# Patient Record
Sex: Male | Born: 1985 | Race: Black or African American | Hispanic: No | Smoking: Heavy tobacco smoker
Health system: Southern US, Community
[De-identification: ages and names within clinical notes are randomized; demographics above are authoritative.]

## PROBLEM LIST (undated history)

## (undated) HISTORY — PX: TONSILLECTOMY: SUR1361

---

## 2014-09-08 ENCOUNTER — Emergency Department (HOSPITAL_BASED_OUTPATIENT_CLINIC_OR_DEPARTMENT_OTHER)
Admission: EM | Admit: 2014-09-08 | Discharge: 2014-09-09 | Disposition: A | Discharge status: Home / Self Care | Payer: Self-pay | Attending: Emergency Medicine | Admitting: Emergency Medicine

## 2014-09-08 ENCOUNTER — Emergency Department (HOSPITAL_BASED_OUTPATIENT_CLINIC_OR_DEPARTMENT_OTHER): Payer: Self-pay

## 2014-09-08 ENCOUNTER — Encounter (HOSPITAL_BASED_OUTPATIENT_CLINIC_OR_DEPARTMENT_OTHER): Payer: Self-pay | Admitting: Emergency Medicine

## 2014-09-08 DIAGNOSIS — J069 Acute upper respiratory infection, unspecified: Secondary | ICD-10-CM | POA: Insufficient documentation

## 2014-09-08 DIAGNOSIS — R05 Cough: Secondary | ICD-10-CM | POA: Insufficient documentation

## 2014-09-08 DIAGNOSIS — R059 Cough, unspecified: Secondary | ICD-10-CM

## 2014-09-08 LAB — RAPID STREP SCREEN (MED CTR MEBANE ONLY): Streptococcus, Group A Screen (Direct): NEGATIVE

## 2014-09-08 MED ORDER — ALBUTEROL SULFATE HFA 108 (90 BASE) MCG/ACT IN AERS
2.0000 | INHALATION_SPRAY | Freq: Once | RESPIRATORY_TRACT | Status: AC
  Administered 2014-09-09: 2 via RESPIRATORY_TRACT
  Filled 2014-09-08: qty 6.7

## 2014-09-08 MED ORDER — HYDROCODONE-ACETAMINOPHEN 5-325 MG PO TABS: ORAL_TABLET | ORAL | Status: DC

## 2014-09-08 MED ORDER — HYDROCODONE-ACETAMINOPHEN 5-325 MG PO TABS
1.0000 | ORAL_TABLET | Freq: Once | ORAL | Status: AC
  Administered 2014-09-08: 1 via ORAL
  Filled 2014-09-08: qty 1

## 2014-09-08 MED ORDER — ALBUTEROL SULFATE (2.5 MG/3ML) 0.083% IN NEBU
INHALATION_SOLUTION | RESPIRATORY_TRACT | Status: AC
  Administered 2014-09-08: 5 mg via RESPIRATORY_TRACT
  Filled 2014-09-08: qty 6

## 2014-09-08 MED ORDER — IBUPROFEN 400 MG PO TABS
400.0000 mg | ORAL_TABLET | Freq: Once | ORAL | Status: AC
  Administered 2014-09-08: 400 mg via ORAL

## 2014-09-08 MED ORDER — ALBUTEROL SULFATE (2.5 MG/3ML) 0.083% IN NEBU
5.0000 mg | INHALATION_SOLUTION | Freq: Once | RESPIRATORY_TRACT | Status: AC
  Administered 2014-09-08: 5 mg via RESPIRATORY_TRACT

## 2014-09-08 MED ORDER — IBUPROFEN 400 MG PO TABS
ORAL_TABLET | ORAL | Status: AC
  Filled 2014-09-08: qty 1

## 2014-09-08 NOTE — ED Notes (Signed)
Pt rports upper resp signs and symptoms cough and chest soreness OTC meds ineffective

## 2014-09-08 NOTE — Discharge Instructions (Signed)
Take vicodin for breakthrough pain, do not drink alcohol, drive, care for children or do other critical tasks while taking vicodin.  Do not hesitate to return to the emergency room for any new, worsening or concerning symptoms.  Please obtain primary care using resource guide below. But the minute you were seen in the emergency room and that they will need to obtain records for further outpatient management.   Upper Respiratory Infection, Adult An upper respiratory infection (URI) is also sometimes known as the common cold. The upper respiratory tract includes the nose, sinuses, throat, trachea, and bronchi. Bronchi are the airways leading to the lungs. Most people improve within 1 week, but symptoms can last up to 2 weeks. A residual cough may last even longer.  CAUSES Many different viruses can infect the tissues lining the upper respiratory tract. The tissues become irritated and inflamed and often become very moist. Mucus production is also common. A cold is contagious. You can easily spread the virus to others by oral contact. This includes kissing, sharing a glass, coughing, or sneezing. Touching your mouth or nose and then touching a surface, which is then touched by another person, can also spread the virus. SYMPTOMS  Symptoms typically develop 1 to 3 days after you come in contact with a cold virus. Symptoms vary from person to person. They may include:  Runny nose.  Sneezing.  Nasal congestion.  Sinus irritation.  Sore throat.  Loss of voice (laryngitis).  Cough.  Fatigue.  Muscle aches.  Loss of appetite.  Headache.  Low-grade fever. DIAGNOSIS  You might diagnose your own cold based on familiar symptoms, since most people get a cold 2 to 3 times a year. Your caregiver can confirm this based on your exam. Most importantly, your caregiver can check that your symptoms are not due to another disease such as strep throat, sinusitis, pneumonia, asthma, or epiglottitis.  Blood tests, throat tests, and X-rays are not necessary to diagnose a common cold, but they may sometimes be helpful in excluding other more serious diseases. Your caregiver will decide if any further tests are required. RISKS AND COMPLICATIONS  You may be at risk for a more severe case of the common cold if you smoke cigarettes, have chronic heart disease (such as heart failure) or lung disease (such as asthma), or if you have a weakened immune system. The very young and very old are also at risk for more serious infections. Bacterial sinusitis, middle ear infections, and bacterial pneumonia can complicate the common cold. The common cold can worsen asthma and chronic obstructive pulmonary disease (COPD). Sometimes, these complications can require emergency medical care and may be life-threatening. PREVENTION  The best way to protect against getting a cold is to practice good hygiene. Avoid oral or hand contact with people with cold symptoms. Wash your hands often if contact occurs. There is no clear evidence that vitamin C, vitamin E, echinacea, or exercise reduces the chance of developing a cold. However, it is always recommended to get plenty of rest and practice good nutrition. TREATMENT  Treatment is directed at relieving symptoms. There is no cure. Antibiotics are not effective, because the infection is caused by a virus, not by bacteria. Treatment may include:  Increased fluid intake. Sports drinks offer valuable electrolytes, sugars, and fluids.  Breathing heated mist or steam (vaporizer or shower).  Eating chicken soup or other clear broths, and maintaining good nutrition.  Getting plenty of rest.  Using gargles or lozenges for comfort.  Controlling  fevers with ibuprofen or acetaminophen as directed by your caregiver.  Increasing usage of your inhaler if you have asthma. Zinc gel and zinc lozenges, taken in the first 24 hours of the common cold, can shorten the duration and lessen the  severity of symptoms. Pain medicines may help with fever, muscle aches, and throat pain. A variety of non-prescription medicines are available to treat congestion and runny nose. Your caregiver can make recommendations and may suggest nasal or lung inhalers for other symptoms.  HOME CARE INSTRUCTIONS   Only take over-the-counter or prescription medicines for pain, discomfort, or fever as directed by your caregiver.  Use a warm mist humidifier or inhale steam from a shower to increase air moisture. This may keep secretions moist and make it easier to breathe.  Drink enough water and fluids to keep your urine clear or pale yellow.  Rest as needed.  Return to work when your temperature has returned to normal or as your caregiver advises. You may need to stay home longer to avoid infecting others. You can also use a face mask and careful hand washing to prevent spread of the virus. SEEK MEDICAL CARE IF:   After the first few days, you feel you are getting worse rather than better.  You need your caregiver's advice about medicines to control symptoms.  You develop chills, worsening shortness of breath, or brown or red sputum. These may be signs of pneumonia.  You develop yellow or brown nasal discharge or pain in the face, especially when you bend forward. These may be signs of sinusitis.  You develop a fever, swollen neck glands, pain with swallowing, or white areas in the back of your throat. These may be signs of strep throat. SEEK IMMEDIATE MEDICAL CARE IF:   You have a fever.  You develop severe or persistent headache, ear pain, sinus pain, or chest pain.  You develop wheezing, a prolonged cough, cough up blood, or have a change in your usual mucus (if you have chronic lung disease).  You develop sore muscles or a stiff neck. Document Released: 12/22/2000 Document Revised: 09/20/2011 Document Reviewed: 10/03/2013 [facility] Patient Information 2015 [place], [place]. This information is  not intended to replace advice given to you by your health care provider. Make sure you discuss any questions you have with your health care provider.    Emergency Department Resource Guide 1) Find a Doctor and Pay Out of Pocket Although you won't have to find out who is covered by your insurance plan, it is a good idea to ask around and get recommendations. You will then need to call the office and see if the doctor you have chosen will accept you as a new patient and what types of options they offer for patients who are self-pay. Some doctors offer discounts or will set up payment plans for their patients who do not have insurance, but you will need to ask so you aren't surprised when you get to your appointment.  2) Contact Your Local Health Department Not all health departments have doctors that can see patients for sick visits, but many do, so it is worth a call to see if yours does. If you don't know where your local health department is, you can check in your phone book. The CDC also has a tool to help you locate your state's health department, and many state websites also have listings of all of their local health departments.  3) Find a Walk-in Clinic If your illness is not  likely to be very severe or complicated, you may want to try a walk in clinic. These are popping up all over the country in pharmacies, drugstores, and shopping centers. They're usually staffed by nurse practitioners or physician assistants that have been trained to treat common illnesses and complaints. They're usually fairly quick and inexpensive. However, if you have serious medical issues or chronic medical problems, these are probably not your best option.  No Primary Care Doctor: - Call Health Connect at  [phone] - they can help you locate a primary care doctor that  accepts your insurance, provides certain services, etc. - Physician Referral Service- [phone]  Chronic Pain Problems: Organization          Address  Phone   Notes  [facility]  [phone] Patients need to be referred by their primary care doctor.   Medication Assistance: Organization         Address  Phone   Notes  [facility] Medication [facility] [address] [place], [place] [address] [phone] --Must be a resident of [facility] -- Must have NO insurance coverage whatsoever (no Medicaid/ Medicare, etc.) -- The pt. MUST have a primary care doctor that directs their care regularly and follows them in the community   MedAssist  [phone]   [employer]  [phone]    Agencies that provide inexpensive medical care: Organization         Address  Phone   Notes  [place]  [phone]   [facility]    [phone]   [facility] [place], [place] [address] [phone]   [place] [address], [place] [phone]   Planned Parenthood    [phone]   [facility]    [phone]   [place] [address], [place] Phone:  [phone], Fax:  [phone] Hours of Operation:  9 am - 6 pm, M-F.  Also accepts Medicaid/Medicare and self-pay.  [facility] for [place], [address], [place] Phone: [phone], Fax: [phone]. Hours of Operation:  8:30 am - 5:30 pm, M-F.  Also accepts Medicaid and self-pay.  [facility] [address] [place] Phone: [phone]   [place], [place], [place] [phone], Ext. [phone] Mondays & Thursdays: 7-9 AM.  First 15 patients are seen on a first come, first serve basis.    [place] Providers:  Organization         Address  Phone   Notes  [facility] [address], [place] [phone] Also accepts self-pay patients.  [facility] [address], [place]  [phone]   [place], [address], [place] [phone]   [facility] [address] [place] [phone]   Lucianne Lei [address], [place]   [phone] Only accepts [place] Access [place] patients after they have their name applied to their card.   Self-Pay (no insurance) in [facility]:  Organization         Address  Phone   Notes  Sickle Cell Patients, [facility] Internal Medicine [place] [phone]   [facility]arias Pontes  [facility] [place] [phone]   [facility] [place]  [place], [place], [place] [phone]   [facility]/Dr. Osei-Bonsu  [address] [place] or [address], [place] [phone] Phone number for both [place] and [place] locations is the same.  [facility] [address] [place] [phone]   [facility] [address], [place] or [address] [phone]   [facility] [address] [place] [phone], phone; [phone], fax Sees patients 1st and 3rd Saturday of every month.  Must not qualify for public or private insurance (i.e. Medicaid, Medicare, [place] Health Choice, Veterans' Benefits)  Household income should be no more than 200% of the poverty level The clinic cannot treat you if you are pregnant or think you are pregnant  Sexually transmitted diseases are not treated at the clinic.    Dental Care: Organization         Address  Phone  Notes  [facility] Department of [facility] [place] [phone] Accepts children up to age 13 who are enrolled in [place] or [place]; pregnant women with a Medicaid card; and  children who have applied for Medicaid or [place] Health Choice, but were declined, whose parents can pay a reduced fee at time of service.  [facility] Department of [facility]  [address], [place] [phone] Accepts children up to age 17 who are enrolled in [place] or [place]; pregnant women with a Medicaid card; and children who have applied for Medicaid or [place] Health Choice, but were declined, whose parents can pay a reduced fee at time of service.  [place] Adult Dental Access PROGRAM  [place] [phone] Patients are seen by appointment only. Walk-ins are not accepted. [place] will see patients 48 years of age and older. Monday - Tuesday (8am-5pm) Most Wednesdays (8:30-5pm) $30 per visit, cash only  [facility] Adult Dental Access PROGRAM  [address], [facility] [phone] Patients are seen by appointment only. Walk-ins are not accepted. [place] will see patients 81 years of age and older. One Wednesday Evening (Monthly: Volunteer Based).  $30 per visit, cash only  [place]  [phone] for adults; Children under age 18, call Graduate Pediatric Dentistry at [phone]. Children aged 77-14, please call [phone] to request a pediatric application.  Dental services are provided in all areas of dental care including fillings, crowns and bridges, complete and partial dentures, implants, gum treatment, root canals, and extractions. Preventive care is also provided. Treatment is provided to both adults and children. Patients are selected via a lottery and there is often a waiting list.   [facility] [address] [place]  [phone] [email or web address]   [facility] [address] [place], [place] [phone], Ext. [phone] Second and Fourth Thursday of each month, opens at 6:30 AM; Clinic ends at 9 AM.  Patients are seen on a first-come first-served  basis, and a limited number are seen during each clinic.   [facility]  [address] [place] Danker [place], [place] [phone]   Eligibility Requirements You must have lived in [place], [place], or [place] counties for at least the last three  months.   You cannot be eligible for state or federal sponsored [employer], including [employer], [place], or [employer].   You generally cannot be eligible for healthcare insurance through your employer.    How to apply: Eligibility screenings are held every Tuesday and Wednesday afternoon from 1:00 pm until 4:00 pm. You do not need an appointment for the interview!  [facility] [address] [place], [place]   [place]  [place] Department  [place]  [phone]    Behavioral Health Resources in the Community: Intensive Outpatient Programs Organization         Address  Phone  Notes  [place]. [address] [place], [place] [phone]   [facility] Outpatient [address], [place], [place]   ADS: Alcohol & Drug Svcs [address], [place], [place]   [place] [address],  [place], [place] or [phone]   Substance Abuse Resources Organization         Address  Phone  Notes  Alcohol and Drug Services  [phone]   [place]  [phone]   The [place]   [place]inita Pester  [phone]   Residential & Outpatient Substance Abuse Program  [phone]   Psychological Services Organization         Address  Phone  Notes  [facility] [place]  [place]  [phone]   [place] [address] [place] or [phone]    Mobile Crisis Teams Organization          Address  Phone  Notes  [facility], Mobile Crisis Care Unit  [phone]   [facility]  [address] [place], [place]   [place] [address] [place] [phone]    Self-Help/Support Groups Organization         Address  Phone             Notes  [place]. of [place] - variety of support groups  [place] Call for more information  Narcotics Anonymous (NA), [facility] [address], [employer] [place]  2 meetings at this location   Special educational needs teacher         Address  Phone  Notes  [facility]idential Treatment [place],    [place]  [phone]   [facility]  [address] [place] [phone], [place], [place]   [place], [place] [phone] Admissions: 8am-3pm M-F  Incentives Substance [place] [address],    [place], [place] [phone]   [facility] [address] [place], [place], [place]   [facility] [address]  [place], [place]   [facility] - Intensive Outpatient [address] Kristeen [address], [place], [place]   [facility] [place].,  [place], [place] [phone] or [phone]   [facility] (RTS) [address] [place], [place] Accepts Medicaid  [facility] [place] [address]  [place] [phone] Substance Abuse/Addiction Treatment   [facility] Organization         Address  Phone  Notes  [facility]  [phone]   Domenic Schwab, PhD  [address] [place], [place]   [phone]) [phone] or [phone]   [facility]   [address] [place], [place] [phone]) [phone]   [facility] [address], [place], [place] [phone] Insurance/Medicaid/sponsorship  through [facility]  Faith and Families [address]                                    [place], [place] [phone]) [phone] Therapy/tele-psych/case  [facility] [address]   [place], [place] [phone]) [phone]    Dr. Lolly MustacheArfeen  [phone]) [phone]   [facility] [place]  [employer] [facility] 1) [address], [place] 2) [address], [place] 3)  [address], [place] [phone]) [phone]) [phone]  [phone](336) [phone]   [facility] Child Abuse Hotline [phone](336) 7183455689 or [phone](336) [phone] (After Hours)

## 2014-09-08 NOTE — ED Provider Notes (Signed)
CSN: [id number]     Arrival date & time 09/08/14  2051 History  This chart was scribed for Steven EmeryNicole Daniyah Fohl, PA-C with No att. providers found by Tonye RoyaltyJoshua Chen, ED Scribe. This patient was seen in room MHT13/MHT13 and the patient's care was started at 10:31 PM.    Chief Complaint  Patient presents with  . URI   The history is provided by the patient. No language interpreter was used.    HPI Comments: Steven BogaWilliam R Lowery is a 29 y.o. male who presents to the Emergency Department complaining of cough with onset 4 days ago and chest pain with onset at 1600 today. He reports associated congestion, sore throat, and SOB. He states his cough is not productive. He states he is using Theraflu at home. He denies history of asthma. He denies nausea, vomiting, or sinus pressure.   History reviewed. No pertinent past medical history. History reviewed. No pertinent past surgical history. History reviewed. No pertinent family history. History  Substance Use Topics  . Smoking status: Heavy Tobacco Smoker  . Smokeless tobacco: Never Used  . Alcohol Use: Yes    Review of Systems  HENT: Positive for congestion and sore throat. Negative for sinus pressure.   Respiratory: Positive for cough.   Cardiovascular: Positive for chest pain.  Gastrointestinal: Negative for nausea and vomiting.      Allergies  Review of patient's allergies indicates no known allergies.  Home Medications   Prior to Admission medications   Medication Sig Start Date End Date Taking? Authorizing Provider  HYDROcodone-acetaminophen (NORCO/VICODIN) 5-325 MG per tablet Take 1-2 tablets by mouth every 6 hours as needed for pain and/or cough. 09/08/14   Dannon Nguyenthi, PA-C   BP 127/76 mmHg  Pulse 71  Temp(Src) 98.9 F (37.2 C) (Oral)  Resp 18  SpO2 98% Physical Exam  Constitutional: He is oriented to person, place, and time. He appears well-developed and well-nourished. No distress.  HENT:  Head: Normocephalic and  atraumatic.  Mouth/Throat: Oropharynx is clear and moist.  + Rhinorrhea, No TTP of sinuses  Eyes: Conjunctivae and EOM are normal. Pupils are equal, round, and reactive to light.  Neck: Normal range of motion.  Cardiovascular: Normal rate, regular rhythm and intact distal pulses.   Pulmonary/Chest: Effort normal and breath sounds normal. No stridor.  Abdominal: Soft. There is no tenderness.  Musculoskeletal: Normal range of motion.  Neurological: He is alert and oriented to person, place, and time.  Skin: He is not diaphoretic.  Psychiatric: He has a normal mood and affect.  Nursing note and vitals reviewed.   ED Course  Procedures (including critical care time)  DIAGNOSTIC STUDIES: Oxygen Saturation is 98% on room air, normal by my interpretation.    COORDINATION OF CARE: 10:34 PM Discussed treatment plan with patient at beside, including chest x-ray. He states he feels improved after breathing treatment. The patient agrees with the plan and has no further questions at this time.   Labs Review Labs Reviewed  RAPID STREP SCREEN  CULTURE, GROUP A STREP    Imaging Review Dg Chest 2 View  09/08/2014   CLINICAL DATA:  Acute onset of cough and chest pain. Congestion, sore throat and shortness of breath. Initial encounter.  EXAM: CHEST  2 VIEW  COMPARISON:  None.  FINDINGS: The lungs are well-aerated and clear. There is no evidence of focal opacification, pleural effusion or pneumothorax.  The heart is normal in size; the mediastinal contour is within normal limits. No acute osseous abnormalities are seen.  IMPRESSION: No acute cardiopulmonary process seen.   Electronically Signed   By: Roanna Raider M.D.   On: 09/08/2014 23:43     EKG Interpretation None      MDM   Final diagnoses:  Cough  Upper respiratory infection   Filed Vitals:   09/08/14 2110 09/08/14 2156  BP: 122/66 127/76  Pulse: 80 71  Temp: 98.5 F (36.9 C) 98.9 F (37.2 C)  TempSrc: Oral Oral  Resp: 18  18  SpO2: 96% 98%    Medications  ibuprofen (ADVIL,MOTRIN) tablet 400 mg (400 mg Oral Given 09/08/14 2205)  albuterol (PROVENTIL) (2.5 MG/3ML) 0.083% nebulizer solution 5 mg (5 mg Nebulization Given 09/08/14 2207)  HYDROcodone-acetaminophen (NORCO/VICODIN) 5-325 MG per tablet 1 tablet (1 tablet Oral Given 09/08/14 2342)  albuterol (PROVENTIL HFA;VENTOLIN HFA) 108 (90 BASE) MCG/ACT inhaler 2 puff (2 puffs Inhalation Given 09/09/14 0003)    Steven Wang is a pleasant 29 y.o. male presenting with URI symtoms. LSTA, CXR negative. VSS, Likely viral. Will give norco for cough.  Evaluation does not show pathology that would require ongoing emergent intervention or inpatient treatment. Pt is hemodynamically stable and mentating appropriately. Discussed findings and plan with patient/guardian, who agrees with care plan. All questions answered. Return precautions discussed and outpatient follow up given.   Discharge Medication List as of 09/08/2014 11:51 PM    START taking these medications   Details  HYDROcodone-acetaminophen (NORCO/VICODIN) 5-325 MG per tablet Take 1-2 tablets by mouth every 6 hours as needed for pain and/or cough., Print         I personally performed the services described in this documentation, which was scribed in my presence. The recorded information has been reviewed and is accurate.   Steven Emery, PA-C 09/09/14 1607  [name]. Rubin Payor, MD 09/10/14 2127

## 2014-09-08 NOTE — ED Notes (Addendum)
Alert, NAD, calm, itneractiove, resps e/u, speaking in clear complete sentences, VSS, c/o non-productive cough, subjective fever, pain with cough, sore throat & congestion. No meds PTA. (denies: nvd, dizziness), sx onset Monday.

## 2014-09-08 NOTE — ED Notes (Signed)
Pt to xray, steady gait

## 2014-09-11 LAB — CULTURE, GROUP A STREP

## 2016-12-22 ENCOUNTER — Encounter (HOSPITAL_BASED_OUTPATIENT_CLINIC_OR_DEPARTMENT_OTHER): Payer: Self-pay | Admitting: *Deleted

## 2016-12-22 ENCOUNTER — Emergency Department (HOSPITAL_BASED_OUTPATIENT_CLINIC_OR_DEPARTMENT_OTHER)
Admission: EM | Admit: 2016-12-22 | Discharge: 2016-12-23 | Disposition: A | Discharge status: Home / Self Care | Payer: Self-pay | Attending: Emergency Medicine | Admitting: Emergency Medicine

## 2016-12-22 ENCOUNTER — Emergency Department (HOSPITAL_BASED_OUTPATIENT_CLINIC_OR_DEPARTMENT_OTHER): Payer: Self-pay

## 2016-12-22 DIAGNOSIS — F17200 Nicotine dependence, unspecified, uncomplicated: Secondary | ICD-10-CM | POA: Insufficient documentation

## 2016-12-22 DIAGNOSIS — N5089 Other specified disorders of the male genital organs: Secondary | ICD-10-CM | POA: Insufficient documentation

## 2016-12-22 LAB — BASIC METABOLIC PANEL
ANION GAP: 10 (ref 5–15)
BUN: 14 mg/dL (ref 6–20)
CALCIUM: 9.7 mg/dL (ref 8.9–10.3)
CO2: 30 mmol/L (ref 22–32)
Chloride: 98 mmol/L — ABNORMAL LOW (ref 101–111)
Creatinine, Ser: 0.95 mg/dL (ref 0.61–1.24)
GFR calc non Af Amer: >60 mL/min (ref >60)
Glucose, Bld: 114 mg/dL — ABNORMAL HIGH (ref 65–99)
Potassium: 3.6 mmol/L (ref 3.5–5.1)
Sodium: 138 mmol/L (ref 135–145)

## 2016-12-22 LAB — CBC WITH DIFFERENTIAL/PLATELET
Basophils Absolute: 0 10*3/uL (ref 0.0–0.1)
Basophils Relative: 0 %
Eosinophils Absolute: 0.1 10*3/uL (ref 0.0–0.7)
Eosinophils Relative: 1 %
HEMATOCRIT: 45 % (ref 39.0–52.0)
Hemoglobin: 15.2 g/dL (ref 13.0–17.0)
Lymphocytes Relative: 28 %
Lymphs Abs: 2.2 10*3/uL (ref 0.7–4.0)
MCH: 26.2 pg (ref 26.0–34.0)
MCHC: 33.8 g/dL (ref 30.0–36.0)
MCV: 77.5 fL — ABNORMAL LOW (ref 78.0–100.0)
Monocytes Absolute: 0.6 10*3/uL (ref 0.1–1.0)
Monocytes Relative: 8 %
NEUTROS ABS: 5.1 10*3/uL (ref 1.7–7.7)
NEUTROS PCT: 63 %
Platelets: 276 10*3/uL (ref 150–400)
RBC: 5.81 MIL/uL (ref 4.22–5.81)
RDW: 14.1 % (ref 11.5–15.5)
WBC: 8.1 10*3/uL (ref 4.0–10.5)

## 2016-12-22 NOTE — ED Provider Notes (Signed)
[facility] Provider [facility]   CSN: [id number] Arrival date & time: 12/22/16  2213  By signing my name below, I, Steven Wang, attest that this documentation has been prepared under the direction and in the presence of Rolan Bucco, MD. Electronically Signed: Diona Wang, ED Scribe. 12/22/16. 10:34 PM.  History   Chief Complaint Chief Complaint  Patient presents with  . Groin Swelling    HPI Steven Wang is a 31 y.o. male who presents to the Emergency Department complaining of swelling to his left testicle that occurred 2 years ago. Pt reports swelling hasn't increased in the last two years. His friend who had similar sx recently found out he had cancer and so pt has come to get checked out. Pt hasn't had it looked at in the past because it never caused him any pain. Pt denies urgency, frequency, hematuria, dysuria, difficulty urinating, abdominal pain, vomiting, diarrhea, CP, and SOB, or weight loss.  The history is provided by the patient. No language interpreter was used.    History reviewed. No pertinent past medical history.  There are no active problems to display for this patient.   Past Surgical History:  Procedure Laterality Date  . TONSILLECTOMY         Home Medications    Prior to Admission medications   Not on File    Family History History reviewed. No pertinent family history.  Social History Social History  Substance Use Topics  . Smoking status: Heavy Tobacco Smoker    Packs/day: 1.00  . Smokeless tobacco: Never Used  . Alcohol use Yes     Allergies   Patient has no known allergies.   Review of Systems Review of Systems  Constitutional: Negative for chills, diaphoresis, fatigue and fever.  HENT: Negative for congestion, rhinorrhea and sneezing.   Eyes: Negative.   Respiratory: Negative for cough, chest tightness and shortness of breath.   Cardiovascular: Negative for chest pain and leg swelling.  Gastrointestinal:  Negative for abdominal pain, blood in stool, diarrhea, nausea and vomiting.  Genitourinary: Positive for scrotal swelling. Negative for difficulty urinating, dysuria, flank pain, frequency, hematuria and urgency.  Musculoskeletal: Negative for arthralgias and back pain.  Skin: Negative for rash.  Neurological: Negative for dizziness, speech difficulty, weakness, numbness and headaches.     Physical Exam Updated Vital Signs BP (!) 145/89   Pulse 90   Temp 99 F (37.2 C)   Ht 5\' 6"  (1.676 m)   Wt 95.3 kg (210 lb)   SpO2 100%   BMI 33.89 kg/m   Physical Exam  Constitutional: He is oriented to person, place, and time. He appears well-developed and well-nourished.  HENT:  Head: Normocephalic and atraumatic.  Eyes: Pupils are equal, round, and reactive to light.  Neck: Normal range of motion. Neck supple.  Cardiovascular: Normal rate, regular rhythm and normal heart sounds.   Pulmonary/Chest: Effort normal and breath sounds normal. No respiratory distress. He has no wheezes. He has no rales. He exhibits no tenderness.  Abdominal: Soft. Bowel sounds are normal. There is no tenderness. There is no rebound and no guarding.  Genitourinary:  Genitourinary Comments: Chaperone present. Markedly swollen left scrotum, diffusely firm to palpation.  Left testicle not well palpated.  No erythema.   Normal appearing right testicle. Non tender to palpation  Musculoskeletal: Normal range of motion. He exhibits no edema.  Lymphadenopathy:    He has no cervical adenopathy.  Neurological: He is alert and oriented to person, place, and time.  Skin: Skin is warm and dry. No rash noted.  Psychiatric: He has a normal mood and affect.     ED Treatments / Results  DIAGNOSTIC STUDIES: Oxygen Saturation is 100% on RA, normal by my interpretation.   COORDINATION OF CARE: 10:34 PM-Discussed next steps with pt which includes an ultra sound. Pt verbalized understanding and is agreeable with the plan.    Labs (all labs ordered are listed, but only abnormal results are displayed) Labs Reviewed  BASIC METABOLIC PANEL - Abnormal; Notable for the following:       Result Value   Chloride 98 (*)    Glucose, Bld 114 (*)    All other components within normal limits  CBC WITH DIFFERENTIAL/PLATELET - Abnormal; Notable for the following:    MCV 77.5 (*)    All other components within normal limits    EKG  EKG Interpretation None       Radiology No results found.  Procedures Procedures (including critical care time)  Medications Ordered in ED Medications - No data to display   Initial Impression / Assessment and Plan / ED Course  I have reviewed the triage vital signs and the nursing notes.  Pertinent labs & imaging results that were available during my care of the patient were reviewed by me and considered in my medical decision making (see chart for details).     Pt awaiting u/s.  Dr. Blinda LeatherwoodPollina to follow.  Final Clinical Impressions(s) / ED Diagnoses   Final diagnoses:  None    New Prescriptions New Prescriptions   No medications on file   I personally performed the services described in this documentation, which was scribed in my presence.  The recorded information has been reviewed and considered.     Rolan BuccoBelfi, Vitoria Conyer, MD 12/22/16 [phone]

## 2016-12-22 NOTE — ED Notes (Signed)
ED Provider at bedside. 

## 2016-12-22 NOTE — ED Notes (Signed)
Patient transported to Ultrasound 

## 2016-12-22 NOTE — ED Triage Notes (Signed)
C/o left testicle swelling x 2 years, denies pain

## 2016-12-23 NOTE — ED Provider Notes (Signed)
Patient signed out to me to follow-up on scrotal ultrasound. Patient has had some nonpainful swelling of the left side of his scrotum for more than a year. Ultrasound shows a large mass. These findings were discussed with the patient. He was informed that this could in fact be testicular cancer and he needs prompt follow-up. Will refer to urology for further outpatient workup.   Gilda CreasePollina, Christopher J, MD 12/23/16 [phone]

## 2016-12-23 NOTE — ED Notes (Signed)
ED Provider at bedside. 

## 2018-10-17 IMAGING — US US ART/VEN ABD/PELV/SCROTUM DOPPLER LTD
1 series · 13 of 25 positions shown · non-contrast
Comparison: None.

CLINICAL DATA: Chronic left scrotal swelling.  Initial encounter.

EXAM:
SCROTAL ULTRASOUND
DOPPLER ULTRASOUND OF THE TESTICLES
TECHNIQUE: Complete ultrasound examination of the testicles, epididymis, and
other scrotal structures was performed. Color and spectral Doppler
ultrasound were also utilized to evaluate blood flow to the
testicles.

[Series 1: us art/ven abd/pelv/scrotum doppler ltd · 0.18mm/px · 13 of 36 slices shown]
[im 1/36]
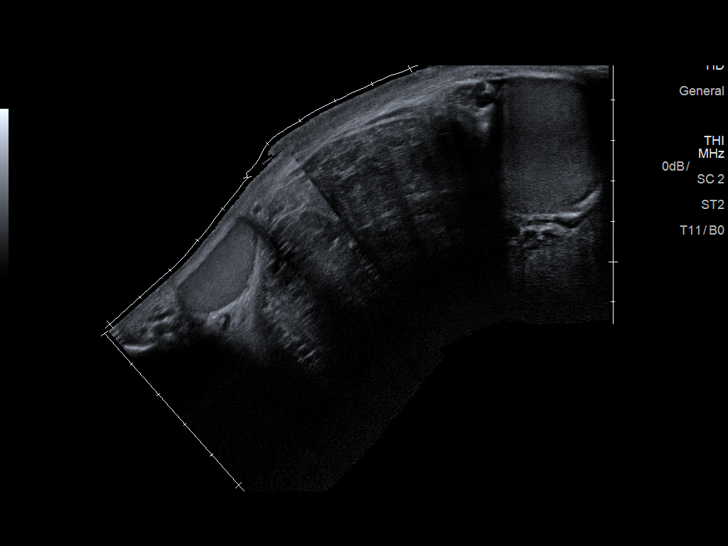
[im 3/36]
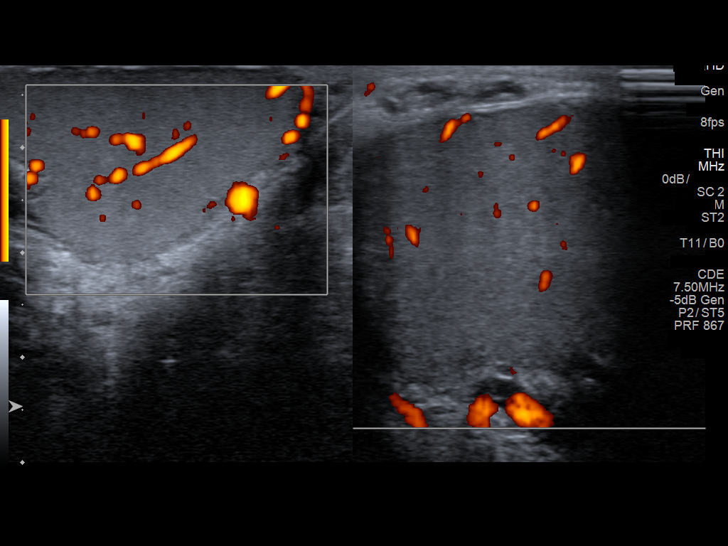
[im 6/36]
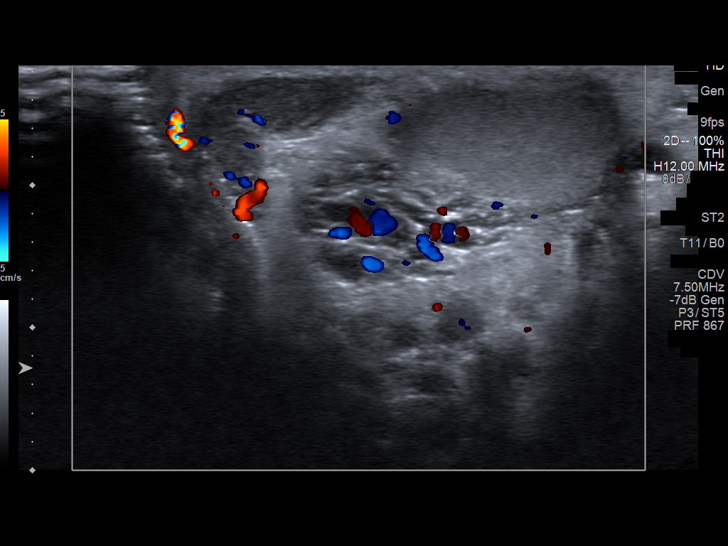
[im 9/36]
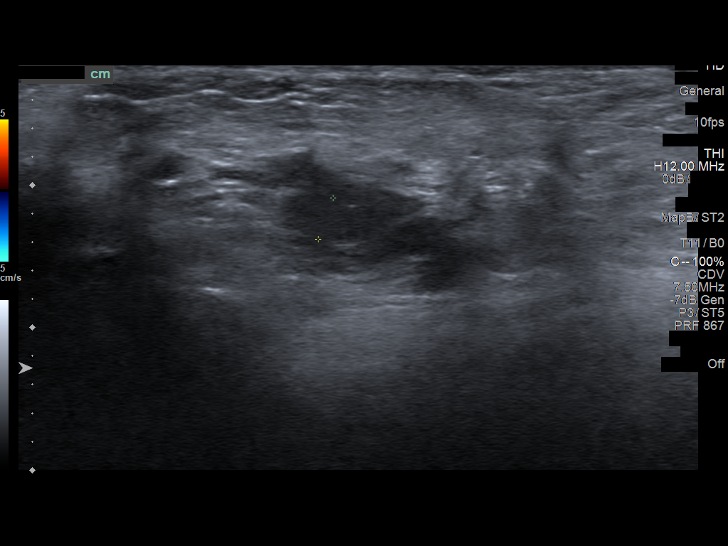
[im 12/36]
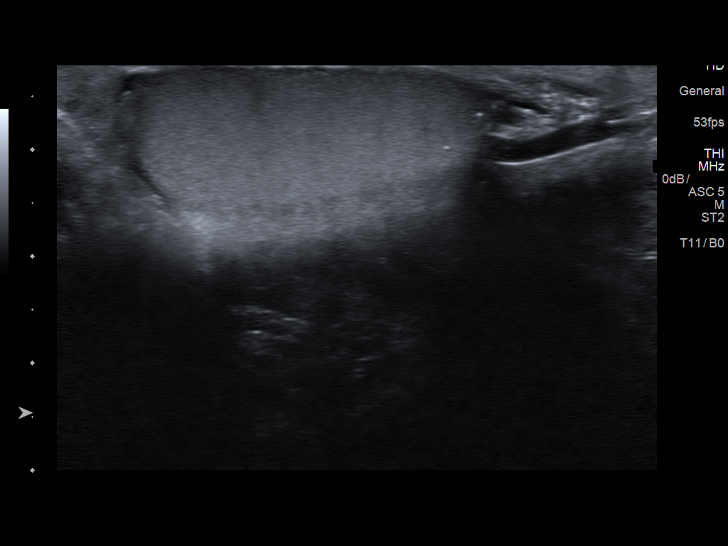
[im 15/36]
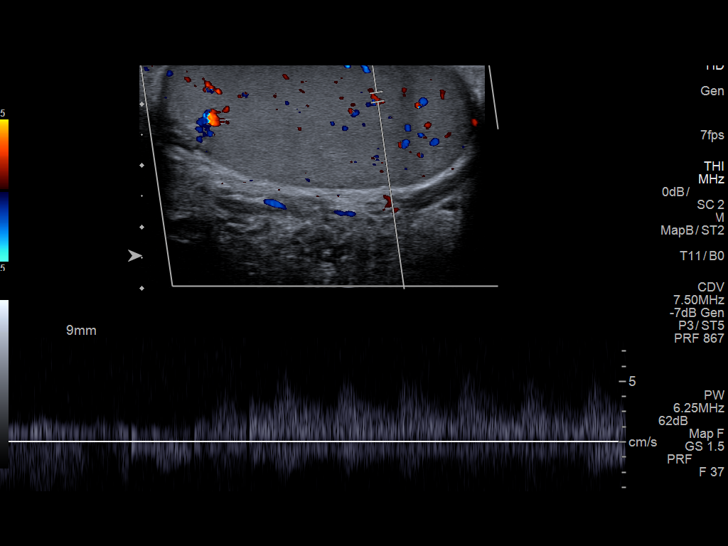
[im 18/36]
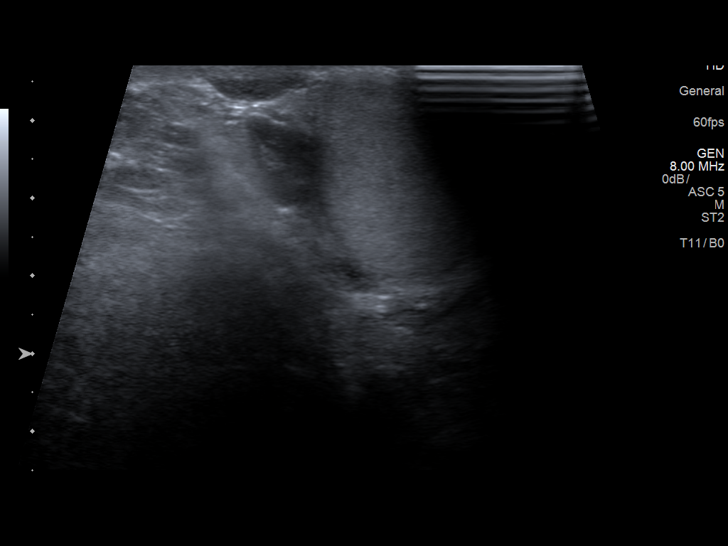
[im 21/36]
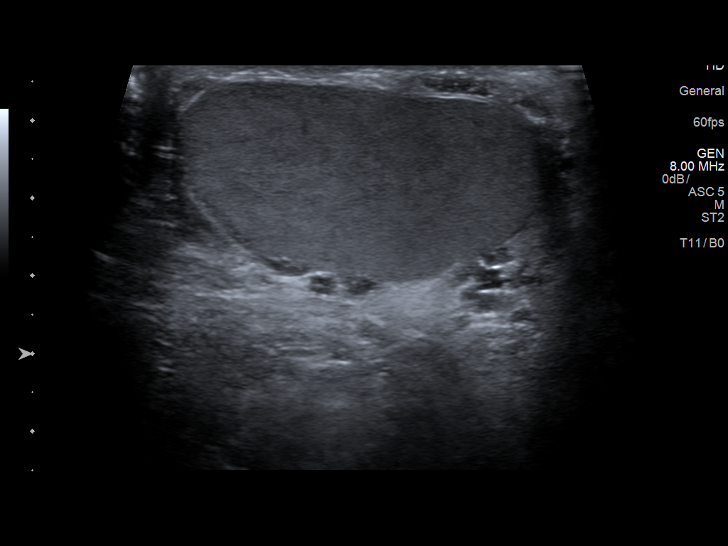
[im 24/36]
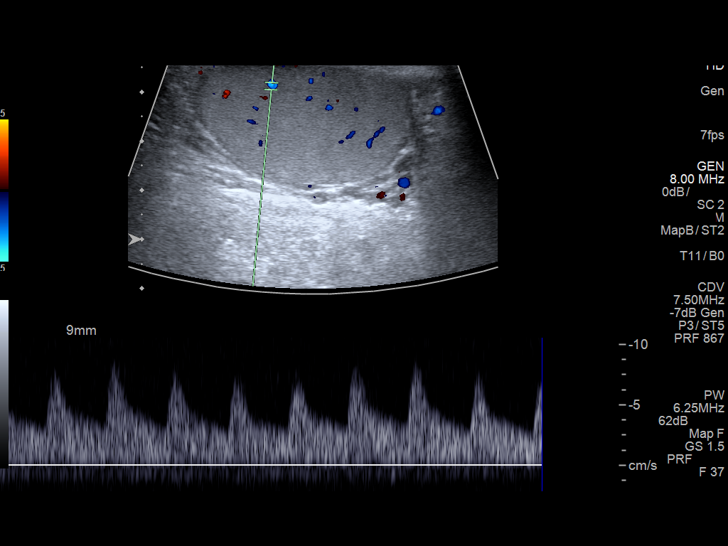
[im 27/36]
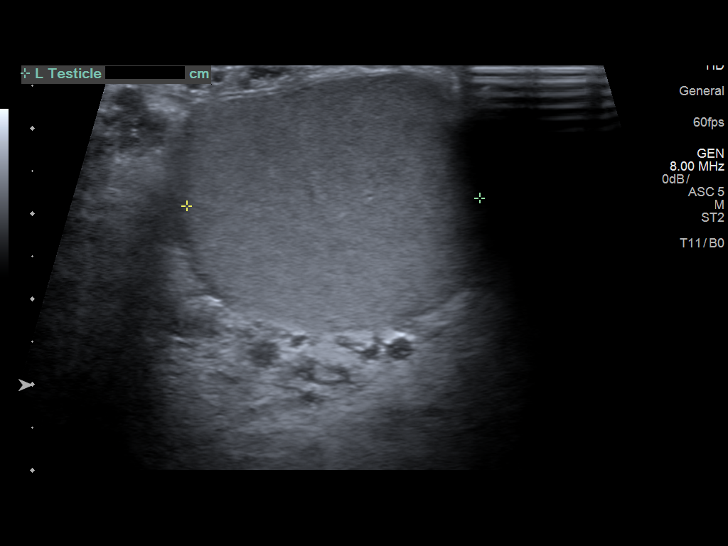
[im 30/36]
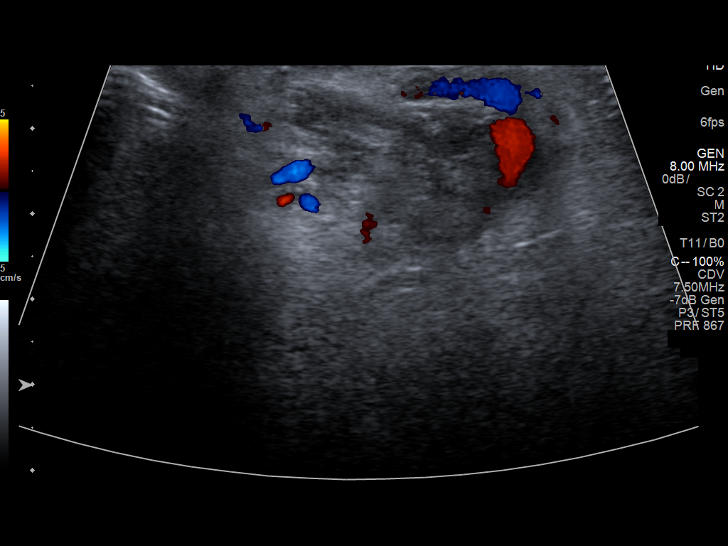
[im 33/36]
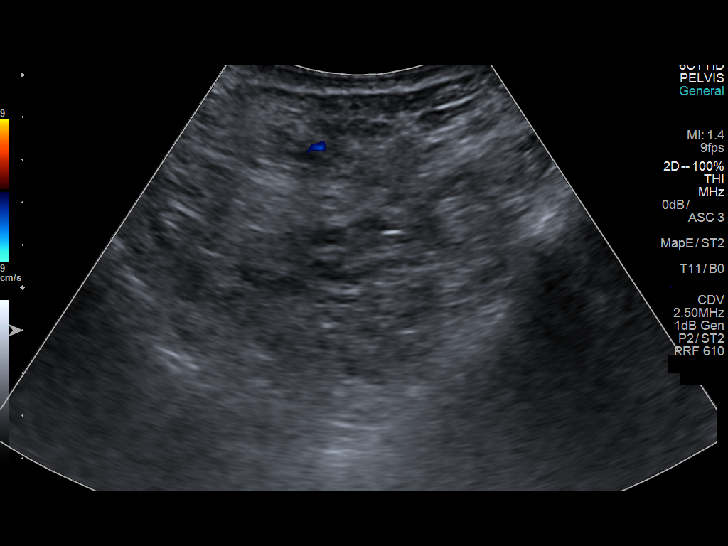
[im 36/36]
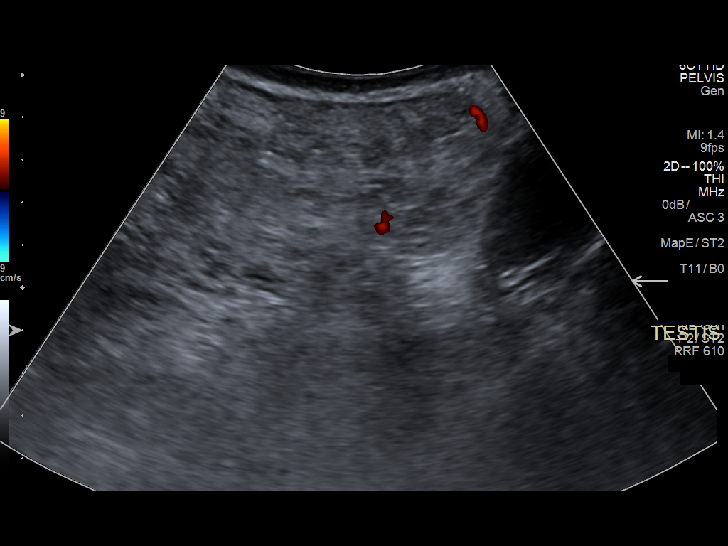

[13 of 25 positions shown; findings below may reference images not displayed]

FINDINGS: Right testicle

Measurements: 4.9 x 2.3 x 3.1 cm. No mass or microlithiasis
visualized.

Left testicle

Measurements: 4.8 x 2.4 x 3.4 cm. No mass or microlithiasis
visualized.

Right epididymis:  Normal in size and appearance.

Left epididymis:  Not well characterized.

Hydrocele:  None visualized.

Varicocele: Patent varicoceles are noted at the superior aspect of
the right testis, and at the inferior posterior aspect of the left
testis.

Pulsed Doppler interrogation of both testes demonstrates normal low
resistance arterial and venous waveforms bilaterally.

There appears to be a large diffusely heterogeneous mass at the
superior left scrotum, measuring 11.2 x 8.2 x 6.9 cm, displacing the
left testis inferiorly and posteriorly. There is only mild
associated vascularity. No bowel herniation is seen.
IMPRESSION: 1. Large diffusely heterogeneous mass at the superior left scrotum,
measuring 11.2 x 2.2 x 6.9 cm, displacing the left testis inferiorly
and posteriorly. Only mild associated vascularity seen; no evidence
of bowel herniation. Biopsy is recommended for further evaluation,
when and as deemed clinically appropriate.
2. No evidence of testicular torsion.
3. Patent varicoceles at the superior aspect of the right testis,
and at the inferior posterior aspect of the left testis.

## 2019-10-20 ENCOUNTER — Encounter (HOSPITAL_COMMUNITY): Payer: Self-pay | Admitting: Emergency Medicine

## 2019-10-20 ENCOUNTER — Emergency Department (HOSPITAL_COMMUNITY): Payer: Self-pay

## 2019-10-20 ENCOUNTER — Observation Stay (HOSPITAL_COMMUNITY)
Admission: EM | Admit: 2019-10-20 | Discharge: 2019-10-22 | Disposition: A | Discharge status: Home / Self Care | Payer: Self-pay | Attending: General Surgery | Admitting: General Surgery

## 2019-10-20 DIAGNOSIS — F17210 Nicotine dependence, cigarettes, uncomplicated: Secondary | ICD-10-CM | POA: Insufficient documentation

## 2019-10-20 DIAGNOSIS — N5089 Other specified disorders of the male genital organs: Secondary | ICD-10-CM | POA: Insufficient documentation

## 2019-10-20 DIAGNOSIS — Z20822 Contact with and (suspected) exposure to covid-19: Secondary | ICD-10-CM | POA: Insufficient documentation

## 2019-10-20 DIAGNOSIS — E669 Obesity, unspecified: Secondary | ICD-10-CM | POA: Insufficient documentation

## 2019-10-20 DIAGNOSIS — K4030 Unilateral inguinal hernia, with obstruction, without gangrene, not specified as recurrent: Principal | ICD-10-CM | POA: Insufficient documentation

## 2019-10-20 DIAGNOSIS — Z6837 Body mass index (BMI) 37.0-37.9, adult: Secondary | ICD-10-CM | POA: Insufficient documentation

## 2019-10-20 DIAGNOSIS — N509 Disorder of male genital organs, unspecified: Secondary | ICD-10-CM | POA: Insufficient documentation

## 2019-10-20 DIAGNOSIS — K4090 Unilateral inguinal hernia, without obstruction or gangrene, not specified as recurrent: Secondary | ICD-10-CM

## 2019-10-20 LAB — URINALYSIS, ROUTINE W REFLEX MICROSCOPIC
Bacteria, UA: NONE SEEN
Glucose, UA: NEGATIVE mg/dL
Hgb urine dipstick: NEGATIVE
Ketones, ur: NEGATIVE mg/dL
Leukocytes,Ua: NEGATIVE
Nitrite: NEGATIVE
Protein, ur: 30 mg/dL — AB
Specific Gravity, Urine: 1.032 — ABNORMAL HIGH (ref 1.005–1.030)
pH: 6 (ref 5.0–8.0)

## 2019-10-20 LAB — COMPREHENSIVE METABOLIC PANEL
ALT: 59 U/L — ABNORMAL HIGH (ref 0–44)
AST: 50 U/L — ABNORMAL HIGH (ref 15–41)
Albumin: 3.3 g/dL — ABNORMAL LOW (ref 3.5–5.0)
Alkaline Phosphatase: 99 U/L (ref 38–126)
Anion gap: 10 (ref 5–15)
BUN: 10 mg/dL (ref 6–20)
CO2: 26 mmol/L (ref 22–32)
Calcium: 8.8 mg/dL — ABNORMAL LOW (ref 8.9–10.3)
Chloride: 101 mmol/L (ref 98–111)
Creatinine, Ser: 1.37 mg/dL — ABNORMAL HIGH (ref 0.61–1.24)
GFR calc Af Amer: >60 mL/min (ref >60)
GFR calc non Af Amer: >60 mL/min (ref >60)
Glucose, Bld: 141 mg/dL — ABNORMAL HIGH (ref 70–99)
Potassium: 3.3 mmol/L — ABNORMAL LOW (ref 3.5–5.1)
Sodium: 137 mmol/L (ref 135–145)
Total Bilirubin: 0.6 mg/dL (ref 0.3–1.2)
Total Protein: 6.6 g/dL (ref 6.5–8.1)

## 2019-10-20 LAB — CBC WITH DIFFERENTIAL/PLATELET
Abs Immature Granulocytes: 0.06 10*3/uL (ref 0.00–0.07)
Basophils Absolute: 0 10*3/uL (ref 0.0–0.1)
Basophils Relative: 0 %
Eosinophils Absolute: 0.7 10*3/uL — ABNORMAL HIGH (ref 0.0–0.5)
Eosinophils Relative: 6 %
HCT: 39.3 % (ref 39.0–52.0)
Hemoglobin: 12.7 g/dL — ABNORMAL LOW (ref 13.0–17.0)
Immature Granulocytes: 1 %
Lymphocytes Relative: 6 %
Lymphs Abs: 0.7 10*3/uL (ref 0.7–4.0)
MCH: 25.6 pg — ABNORMAL LOW (ref 26.0–34.0)
MCHC: 32.3 g/dL (ref 30.0–36.0)
MCV: 79.1 fL — ABNORMAL LOW (ref 80.0–100.0)
Monocytes Absolute: 0.6 10*3/uL (ref 0.1–1.0)
Monocytes Relative: 5 %
Neutro Abs: 10.2 10*3/uL — ABNORMAL HIGH (ref 1.7–7.7)
Neutrophils Relative %: 82 %
Platelets: 248 10*3/uL (ref 150–400)
RBC: 4.97 MIL/uL (ref 4.22–5.81)
RDW: 14.2 % (ref 11.5–15.5)
WBC: 12.3 10*3/uL — ABNORMAL HIGH (ref 4.0–10.5)
nRBC: 0 % (ref 0.0–0.2)

## 2019-10-20 LAB — LACTIC ACID, PLASMA: Lactic Acid, Venous: 1.9 mmol/L (ref 0.5–1.9)

## 2019-10-20 MED ORDER — MORPHINE SULFATE (PF) 4 MG/ML IV SOLN
4.0000 mg | Freq: Once | INTRAVENOUS | Status: AC
  Administered 2019-10-20: 22:00:00 4 mg via INTRAVENOUS
  Filled 2019-10-20: qty 1

## 2019-10-20 MED ORDER — KCL IN DEXTROSE-NACL 20-5-0.45 MEQ/L-%-% IV SOLN
INTRAVENOUS | Status: DC
  Filled 2019-10-20 (×3): qty 1000

## 2019-10-20 MED ORDER — SODIUM CHLORIDE 0.9% FLUSH: 3.0000 mL | Freq: Once | INTRAVENOUS | Status: DC

## 2019-10-20 MED ORDER — MORPHINE SULFATE (PF) 2 MG/ML IV SOLN: 2.0000 mg | INTRAVENOUS | Status: DC | PRN

## 2019-10-20 MED ORDER — ONDANSETRON 4 MG PO TBDP: 4.0000 mg | ORAL_TABLET | Freq: Four times a day (QID) | ORAL | Status: DC | PRN

## 2019-10-20 MED ORDER — METOPROLOL TARTRATE 5 MG/5ML IV SOLN: 5.0000 mg | Freq: Four times a day (QID) | INTRAVENOUS | Status: DC | PRN

## 2019-10-20 MED ORDER — ONDANSETRON HCL 4 MG/2ML IJ SOLN
4.0000 mg | Freq: Four times a day (QID) | INTRAMUSCULAR | Status: DC | PRN
  Administered 2019-10-22: 4 mg via INTRAVENOUS

## 2019-10-20 MED ORDER — DIPHENHYDRAMINE HCL 50 MG/ML IJ SOLN: 25.0000 mg | Freq: Four times a day (QID) | INTRAMUSCULAR | Status: DC | PRN

## 2019-10-20 MED ORDER — SODIUM CHLORIDE 0.9 % IV BOLUS
1000.0000 mL | Freq: Once | INTRAVENOUS | Status: AC
Start: 2019-10-20 — End: 2019-10-21
  Administered 2019-10-20: 1000 mL via INTRAVENOUS

## 2019-10-20 MED ORDER — OXYCODONE HCL 5 MG PO TABS: 5.0000 mg | ORAL_TABLET | Freq: Four times a day (QID) | ORAL | Status: DC | PRN

## 2019-10-20 MED ORDER — CEFAZOLIN SODIUM-DEXTROSE 2-4 GM/100ML-% IV SOLN
2.0000 g | INTRAVENOUS | Status: AC
  Administered 2019-10-22: 2 g via INTRAVENOUS
  Filled 2019-10-20 (×2): qty 100

## 2019-10-20 MED ORDER — IOHEXOL 300 MG/ML  SOLN
100.0000 mL | Freq: Once | INTRAMUSCULAR | Status: AC | PRN
  Administered 2019-10-20: 100 mL via INTRAVENOUS

## 2019-10-20 MED ORDER — ACETAMINOPHEN 650 MG RE SUPP: 650.0000 mg | Freq: Four times a day (QID) | RECTAL | Status: DC | PRN

## 2019-10-20 MED ORDER — POLYETHYLENE GLYCOL 3350 17 G PO PACK: 17.0000 g | PACK | Freq: Every day | ORAL | Status: DC | PRN

## 2019-10-20 MED ORDER — ACETAMINOPHEN 325 MG PO TABS: 650.0000 mg | ORAL_TABLET | Freq: Four times a day (QID) | ORAL | Status: DC | PRN

## 2019-10-20 MED ORDER — DIPHENHYDRAMINE HCL 25 MG PO CAPS: 25.0000 mg | ORAL_CAPSULE | Freq: Four times a day (QID) | ORAL | Status: DC | PRN

## 2019-10-20 NOTE — H&P (Signed)
Reason for Consult: hernai Referring Physician: Arianna Delsanto is an 34 y.o. male.  HPI: 34 yo male with enlarged left scrotal swelling. He had this problem 2 years ago and was having it worked up but then it got smaller and went away. In the last week it has come back. He had some bleeding of his scrotum so came to the ED. He has some pain from the area and his left groin. Pain is worse with movement and lifting. It does not radiate. It is better with rest. He denies nausea or vomiting.  He smoke 1/2 ppd  History reviewed. No pertinent past medical history.  Past Surgical History:  Procedure Laterality Date  . TONSILLECTOMY      No family history on file.  Social History:  reports that he has been smoking. He has been smoking about 1.00 pack per day. He has never used smokeless tobacco. He reports current alcohol use. He reports that he does not use drugs.  Allergies: No Known Allergies  Medications: I have reviewed the patient's current medications.  Results for orders placed or performed during the hospital encounter of 10/20/19 (from the past 48 hour(s))  Lactic acid, plasma     Status: None   Collection Time: 10/20/19  8:10 PM  Result Value Ref Range   Lactic Acid, Venous 1.9 0.5 - 1.9 mmol/L    Comment: Performed at [facility], [address], [place], [place] [address]  Comprehensive metabolic panel     Status: Abnormal   Collection Time: 10/20/19  8:10 PM  Result Value Ref Range   Sodium 137 135 - 145 mmol/L   Potassium 3.3 (L) 3.5 - 5.1 mmol/L   Chloride 101 98 - 111 mmol/L   CO2 26 22 - 32 mmol/L   Glucose, Bld 141 (H) 70 - 99 mg/dL    Comment: Glucose reference range applies only to samples taken after fasting for at least 8 hours.   BUN 10 6 - 20 mg/dL   Creatinine, Ser 2.59 (H) 0.61 - 1.24 mg/dL   Calcium 8.8 (L) 8.9 - 10.3 mg/dL   Total Protein 6.6 6.5 - 8.1 g/dL   Albumin 3.3 (L) 3.5 - 5.0 g/dL   AST 50 (H) 15 - 41 U/L   ALT 59 (H)  0 - 44 U/L   Alkaline Phosphatase 99 38 - 126 U/L   Total Bilirubin 0.6 0.3 - 1.2 mg/dL   GFR calc non Af Amer >60 >60 mL/min   GFR calc Af Amer >60 >60 mL/min   Anion gap 10 5 - 15    Comment: Performed at [facility], [address], [place], [place] [address]  CBC with Differential     Status: Abnormal   Collection Time: 10/20/19  8:10 PM  Result Value Ref Range   WBC 12.3 (H) 4.0 - 10.5 K/uL   RBC 4.97 4.22 - 5.81 MIL/uL   Hemoglobin 12.7 (L) 13.0 - 17.0 g/dL   HCT 56.4 33.2 - 95.1 %   MCV 79.1 (L) 80.0 - 100.0 fL   MCH 25.6 (L) 26.0 - 34.0 pg   MCHC 32.3 30.0 - 36.0 g/dL   RDW 88.4 16.6 - 06.3 %   Platelets 248 150 - 400 K/uL   nRBC 0.0 0.0 - 0.2 %   Neutrophils Relative % 82 %   Neutro Abs 10.2 (H) 1.7 - 7.7 K/uL   Lymphocytes Relative 6 %   Lymphs Abs  0.7 0.7 - 4.0 K/uL   Monocytes Relative 5 %   Monocytes Absolute 0.6 0.1 - 1.0 K/uL   Eosinophils Relative 6 %   Eosinophils Absolute 0.7 (H) 0.0 - 0.5 K/uL   Basophils Relative 0 %   Basophils Absolute 0.0 0.0 - 0.1 K/uL   Immature Granulocytes 1 %   Abs Immature Granulocytes 0.06 0.00 - 0.07 K/uL    Comment: Performed at [facility], [address] [place], [place] [address]  Urinalysis, Routine w reflex microscopic     Status: Abnormal   Collection Time: 10/20/19  9:45 PM  Result Value Ref Range   Color, Urine AMBER (A) YELLOW    Comment: BIOCHEMICALS MAY BE AFFECTED BY COLOR   APPearance CLEAR CLEAR   Specific Gravity, Urine 1.032 (H) 1.005 - 1.030   pH 6.0 5.0 - 8.0   Glucose, UA NEGATIVE NEGATIVE mg/dL   Hgb urine dipstick NEGATIVE NEGATIVE   Bilirubin Urine SMALL (A) NEGATIVE   Ketones, ur NEGATIVE NEGATIVE mg/dL   Protein, ur 30 (A) NEGATIVE mg/dL   Nitrite NEGATIVE NEGATIVE   Leukocytes,Ua NEGATIVE NEGATIVE   RBC / HPF 0-5 0 - 5 RBC/hpf   WBC, UA 6-10 0 - 5 WBC/hpf   Bacteria, UA NONE SEEN NONE SEEN   Mucus PRESENT     Comment: Performed at [facility], [address]  [place], [place] [address]    CT ABDOMEN PELVIS W CONTRAST  Result Date: 10/20/2019 CLINICAL DATA:  34 year old male with left testicular swelling and bleeding. EXAM: CT ABDOMEN AND PELVIS WITH CONTRAST TECHNIQUE: Multidetector CT imaging of the abdomen and pelvis was performed using the standard protocol following bolus administration of intravenous contrast. CONTRAST:  OMNIPAQUE IOHEXOL 300 MG/ML  SOLN COMPARISON:  Pelvic ultrasound dated 12/22/2016. FINDINGS: Lower chest: The visualized lung bases are clear. No intra-abdominal free air or free fluid. Hepatobiliary: There is a 4.2 x 3.2 cm rounded soft tissue structure arising from the inferior aspect of the caudate lobe of the liver (28/4 and 63/7). This demonstrates similar enhancement as the background liver parenchyma and may represent a normal liver tissue or less likely a focal nodular hyperplasia. There is no intrahepatic biliary ductal dilatation. The gallbladder is unremarkable. Pancreas: Unremarkable. No pancreatic ductal dilatation or surrounding inflammatory changes. Spleen: Normal in size without focal abnormality. Adrenals/Urinary Tract: Adrenal glands are unremarkable. Kidneys are normal, without renal calculi, focal lesion, or hydronephrosis. Bladder is unremarkable. Stomach/Bowel: There is no bowel obstruction or active inflammation. The appendix is not visualized with certainty. No inflammatory changes identified in the right lower quadrant. Vascular/Lymphatic: The abdominal aorta and IVC are unremarkable. No portal venous gas. There is no adenopathy. Reproductive: The prostate and seminal vesicles are grossly unremarkable. There is a moderate size fat containing left inguinal hernia with associated mass effect on the left scrotum. There is minimal stranding of the inferior aspect of the herniated fat which may represent mild edema. No fluid collection. Other: Small fat containing right paraumbilical hernia. Musculoskeletal: No acute or  significant osseous findings. IMPRESSION: 1. Moderate size fat containing left inguinal hernia with associated mass effect on the left scrotum. There is minimal stranding of the inferior aspect of the herniated fat which may represent mild edema. No fluid collection. 2. A 4.2 x 3.2 cm rounded soft tissue from the inferior aspect of the caudate lobe of the liver may represent a normal liver tissue or less likely a focal nodular hyperplasia. MRI may provide better  evaluation if clinically indicated. Electronically Signed   By: Anner Crete M.D.   On: 10/20/2019 22:41    Review of Systems  Constitutional: Negative for chills and fever.  HENT: Negative for hearing loss.   Eyes: Negative for blurred vision and double vision.  Respiratory: Negative for cough and hemoptysis.   Cardiovascular: Negative for chest pain and palpitations.  Gastrointestinal: Positive for abdominal pain. Negative for nausea and vomiting.  Genitourinary: Negative for dysuria and urgency.  Musculoskeletal: Negative for myalgias and neck pain.  Skin: Negative for itching and rash.  Neurological: Negative for dizziness, tingling and headaches.  Endo/Heme/Allergies: Does not bruise/bleed easily.  Psychiatric/Behavioral: Negative for depression and suicidal ideas.   PE Blood pressure (!) 143/84, pulse (!) 102, temperature 99.7 F (37.6 C), temperature source Oral, resp. rate 18, SpO2 99 %. Constitutional: NAD; conversant; no deformities Eyes: Moist conjunctiva; no lid lag; anicteric; PERRL Neck: Trachea midline; no thyromegaly Lungs: Normal respiratory effort; no tactile fremitus CV: RRR; no palpable thrills; no pitting edema GI: Abd left large inguinal hernia unable to reduce; no palpable hepatosplenomegaly MSK: unable to assess gait; no clubbing/cyanosis Psychiatric: Appropriate affect; alert and oriented x3 Lymphatic: No palpable cervical or axillary lymphadenopathy   Assessment/Plan: 35 yo male with large inguinal  hernia. CT showing it contains omentum and no signs of obstruction. Given size and symptoms I think it is best for it to be repaired. We will observe him overnight with plan to repair tomorrow.  Arta Bruce Takeyla Million 10/20/2019, 11:47 PM

## 2019-10-20 NOTE — ED Provider Notes (Signed)
[facility] EMERGENCY DEPARTMENT Provider Note   CSN: [id number] Arrival date & time: 10/20/19  1947     History Chief Complaint  Patient presents with  . Bleeding testicles    GOHAN COLLISTER is a 34 y.o. male here with scrotal bleeding.  Patient states that he noticed progressive swelling of the left testicle for the last week and a half.  Patient states that he has increasing pain in the area.  He states that today he noticed that his scrotal sac started bleeding so came here for evaluation.  Patient denies any nausea or vomiting.  He is still passing gas and having normal bowel movements.  Denies any history of previous inguinal hernia in the past.  The history is provided by the patient.       History reviewed. No pertinent past medical history.  There are no problems to display for this patient.   Past Surgical History:  Procedure Laterality Date  . TONSILLECTOMY         No family history on file.  Social History   Tobacco Use  . Smoking status: Heavy Tobacco Smoker    Packs/day: 1.00  . Smokeless tobacco: Never Used  Substance Use Topics  . Alcohol use: Yes  . Drug use: No    Home Medications Prior to Admission medications   Not on File    Allergies    Patient has no known allergies.  Review of Systems   Review of Systems  Genitourinary: Positive for scrotal swelling.  All other systems reviewed and are negative.   Physical Exam Updated Vital Signs BP (!) 143/84   Pulse (!) 102   Temp 99.7 F (37.6 C) (Oral)   Resp 18   SpO2 99%   Physical Exam Vitals and nursing note reviewed.  HENT:     Head: Normocephalic.     Nose: Nose normal.     Mouth/Throat:     Mouth: Mucous membranes are moist.  Eyes:     Extraocular Movements: Extraocular movements intact.     Pupils: Pupils are equal, round, and reactive to light.  Cardiovascular:     Rate and Rhythm: Normal rate and regular rhythm.     Pulses: Normal pulses.   Heart sounds: Normal heart sounds.  Pulmonary:     Effort: Pulmonary effort is normal.     Breath sounds: Normal breath sounds.  Abdominal:     General: Abdomen is flat.     Palpations: Abdomen is soft.  Genitourinary:    Comments: Large L inguinal hernia that is not reducible. Unable to palpate testicles. Some scrotal abrasion with dry blood  Musculoskeletal:     Cervical back: Normal range of motion.  Skin:    General: Skin is warm.     Capillary Refill: Capillary refill takes less than 2 seconds.  Neurological:     General: No focal deficit present.     Mental Status: He is alert and oriented to person, place, and time.  Psychiatric:        Mood and Affect: Mood normal.     ED Results / Procedures / Treatments   Labs (all labs ordered are listed, but only abnormal results are displayed) Labs Reviewed  COMPREHENSIVE METABOLIC PANEL - Abnormal; Notable for the following components:      Result Value   Potassium 3.3 (*)    Glucose, Bld 141 (*)    Creatinine, Ser 1.37 (*)    Calcium 8.8 (*)  Albumin 3.3 (*)    AST 50 (*)    ALT 59 (*)    All other components within normal limits  CBC WITH DIFFERENTIAL/PLATELET - Abnormal; Notable for the following components:   WBC 12.3 (*)    Hemoglobin 12.7 (*)    MCV 79.1 (*)    MCH 25.6 (*)    Neutro Abs 10.2 (*)    Eosinophils Absolute 0.7 (*)    All other components within normal limits  URINALYSIS, ROUTINE W REFLEX MICROSCOPIC - Abnormal; Notable for the following components:   Color, Urine AMBER (*)    Specific Gravity, Urine 1.032 (*)    Bilirubin Urine SMALL (*)    Protein, ur 30 (*)    All other components within normal limits  RESPIRATORY PANEL BY RT PCR (FLU A&B, COVID)  LACTIC ACID, PLASMA    EKG None  Radiology CT ABDOMEN PELVIS W CONTRAST  Result Date: 10/20/2019 CLINICAL DATA:  34 year old male with left testicular swelling and bleeding. EXAM: CT ABDOMEN AND PELVIS WITH CONTRAST TECHNIQUE: Multidetector CT  imaging of the abdomen and pelvis was performed using the standard protocol following bolus administration of intravenous contrast. CONTRAST:  OMNIPAQUE IOHEXOL 300 MG/ML  SOLN COMPARISON:  Pelvic ultrasound dated 12/22/2016. FINDINGS: Lower chest: The visualized lung bases are clear. No intra-abdominal free air or free fluid. Hepatobiliary: There is a 4.2 x 3.2 cm rounded soft tissue structure arising from the inferior aspect of the caudate lobe of the liver (28/4 and 63/7). This demonstrates similar enhancement as the background liver parenchyma and may represent a normal liver tissue or less likely a focal nodular hyperplasia. There is no intrahepatic biliary ductal dilatation. The gallbladder is unremarkable. Pancreas: Unremarkable. No pancreatic ductal dilatation or surrounding inflammatory changes. Spleen: Normal in size without focal abnormality. Adrenals/Urinary Tract: Adrenal glands are unremarkable. Kidneys are normal, without renal calculi, focal lesion, or hydronephrosis. Bladder is unremarkable. Stomach/Bowel: There is no bowel obstruction or active inflammation. The appendix is not visualized with certainty. No inflammatory changes identified in the right lower quadrant. Vascular/Lymphatic: The abdominal aorta and IVC are unremarkable. No portal venous gas. There is no adenopathy. Reproductive: The prostate and seminal vesicles are grossly unremarkable. There is a moderate size fat containing left inguinal hernia with associated mass effect on the left scrotum. There is minimal stranding of the inferior aspect of the herniated fat which may represent mild edema. No fluid collection. Other: Small fat containing right paraumbilical hernia. Musculoskeletal: No acute or significant osseous findings. IMPRESSION: 1. Moderate size fat containing left inguinal hernia with associated mass effect on the left scrotum. There is minimal stranding of the inferior aspect of the herniated fat which may represent  mild edema. No fluid collection. 2. A 4.2 x 3.2 cm rounded soft tissue from the inferior aspect of the caudate lobe of the liver may represent a normal liver tissue or less likely a focal nodular hyperplasia. MRI may provide better evaluation if clinically indicated. Electronically Signed   By: Elgie Collard M.D.   On: 10/20/2019 22:41    Procedures Procedures (including critical care time)  Reduction of hernia I attempted manual compression of L inguinal hernia with no success   Medications Ordered in ED Medications  sodium chloride flush (NS) 0.9 % injection 3 mL (has no administration in time range)  sodium chloride 0.9 % bolus 1,000 mL (1,000 mLs Intravenous New Bag/Given 10/20/19 2158)  morphine 4 MG/ML injection 4 mg (4 mg Intravenous Given 10/20/19 2157)  iohexol (  OMNIPAQUE) 300 MG/ML solution 100 mL (100 mLs Intravenous Contrast Given 10/20/19 2225)    ED Course  I have reviewed the triage vital signs and the nursing notes.  Pertinent labs & imaging results that were available during my care of the patient were reviewed by me and considered in my medical decision making (see chart for details).    MDM Rules/Calculators/A&P                      JADIS MIKA is a 34 y.o. male who presenting with left inguinal hernia.  It is a large hernia and is not reducible.  Will get labs and CT abdomen pelvis.   11:34 PM CT showed left inguinal hernia with mass-effect.  Talked to Dr. Sheliah Hatch from surgery.  He will admit her and perform surgery in the morning.   Final Clinical Impression(s) / ED Diagnoses Final diagnoses:  None    Rx / DC Orders ED Discharge Orders    None       Charlynne Pander, MD 10/20/19 2334

## 2019-10-20 NOTE — ED Triage Notes (Signed)
Pt c/o L testicle swelling and bleeding dark red blood. Pt reports swelling x 1 week, denies hematuria, denies pain, denies dysuria.

## 2019-10-20 NOTE — ED Notes (Signed)
Surgery at bedside.

## 2019-10-21 ENCOUNTER — Other Ambulatory Visit: Payer: Self-pay

## 2019-10-21 LAB — COMPREHENSIVE METABOLIC PANEL
ALT: 52 U/L — ABNORMAL HIGH (ref 0–44)
AST: 35 U/L (ref 15–41)
Albumin: 2.9 g/dL — ABNORMAL LOW (ref 3.5–5.0)
Alkaline Phosphatase: 98 U/L (ref 38–126)
Anion gap: 10 (ref 5–15)
BUN: 9 mg/dL (ref 6–20)
CO2: 26 mmol/L (ref 22–32)
Calcium: 8.5 mg/dL — ABNORMAL LOW (ref 8.9–10.3)
Chloride: 102 mmol/L (ref 98–111)
Creatinine, Ser: 0.91 mg/dL (ref 0.61–1.24)
GFR calc Af Amer: >60 mL/min (ref >60)
GFR calc non Af Amer: >60 mL/min (ref >60)
Glucose, Bld: 112 mg/dL — ABNORMAL HIGH (ref 70–99)
Potassium: 3.1 mmol/L — ABNORMAL LOW (ref 3.5–5.1)
Sodium: 138 mmol/L (ref 135–145)
Total Bilirubin: 0.6 mg/dL (ref 0.3–1.2)
Total Protein: 5.9 g/dL — ABNORMAL LOW (ref 6.5–8.1)

## 2019-10-21 LAB — CBC
HCT: 37.2 % — ABNORMAL LOW (ref 39.0–52.0)
Hemoglobin: 11.8 g/dL — ABNORMAL LOW (ref 13.0–17.0)
MCH: 25.4 pg — ABNORMAL LOW (ref 26.0–34.0)
MCHC: 31.7 g/dL (ref 30.0–36.0)
MCV: 80 fL (ref 80.0–100.0)
Platelets: 235 10*3/uL (ref 150–400)
RBC: 4.65 MIL/uL (ref 4.22–5.81)
RDW: 14.2 % (ref 11.5–15.5)
WBC: 9.8 10*3/uL (ref 4.0–10.5)
nRBC: 0 % (ref 0.0–0.2)

## 2019-10-21 LAB — HIV ANTIBODY (ROUTINE TESTING W REFLEX): HIV Screen 4th Generation wRfx: NONREACTIVE

## 2019-10-21 LAB — SURGICAL PCR SCREEN
MRSA, PCR: NEGATIVE
Staphylococcus aureus: POSITIVE — AB

## 2019-10-21 LAB — RESPIRATORY PANEL BY RT PCR (FLU A&B, COVID)
Influenza A by PCR: NEGATIVE
Influenza B by PCR: NEGATIVE
SARS Coronavirus 2 by RT PCR: NEGATIVE

## 2019-10-21 MED ORDER — ACETAMINOPHEN 500 MG PO TABS
1000.0000 mg | ORAL_TABLET | ORAL | Status: AC
  Administered 2019-10-22: 1000 mg via ORAL
  Filled 2019-10-21: qty 2

## 2019-10-21 MED ORDER — GABAPENTIN 300 MG PO CAPS
300.0000 mg | ORAL_CAPSULE | ORAL | Status: AC
  Administered 2019-10-22: 300 mg via ORAL
  Filled 2019-10-21: qty 1

## 2019-10-21 MED ORDER — ENSURE PRE-SURGERY PO LIQD
296.0000 mL | Freq: Once | ORAL | Status: AC
  Administered 2019-10-21: 296 mL via ORAL
  Filled 2019-10-21 (×2): qty 296

## 2019-10-21 MED ORDER — LIDOCAINE HCL 1 % IJ SOLN
INTRAMUSCULAR | Status: AC
  Filled 2019-10-21: qty 20

## 2019-10-21 MED ORDER — NYSTATIN 100000 UNIT/GM EX POWD
Freq: Two times a day (BID) | CUTANEOUS | Status: DC
  Filled 2019-10-21: qty 15

## 2019-10-21 MED ORDER — IBUPROFEN 600 MG PO TABS: 600.0000 mg | ORAL_TABLET | Freq: Four times a day (QID) | ORAL | Status: DC | PRN

## 2019-10-21 NOTE — Progress Notes (Signed)
Discussed case with Dr. Carolynne Edouard  He has some degree of scrotal rash as well which is likely why it bled initially.  L IH is soft, not tender to manipulation, partially reducible with scrotal component and not "stuck", clearly not strangulated... and on CT contains omentum   Will apply topical nystatin powder today Given the above, may be amenable to a laparoscopic approach with TAPP/TEP to stay out of field of scrotum  NPO after midnight tonight

## 2019-10-21 NOTE — Progress Notes (Signed)
Subjective/Chief Complaint: No complaints other than an occasional shooting pain. No nausea   Objective: Vital signs in last 24 hours: Temp:  [98.6 F (37 C)-99.7 F (37.6 C)] 98.6 F (37 C) (04/11 0549) Pulse Rate:  [96-117] 96 (04/11 0549) Resp:  [18-20] 18 (04/11 0549) BP: (104-143)/(65-84) 120/65 (04/11 0549) SpO2:  [97 %-100 %] 99 % (04/11 0549) Weight:  [102.1 kg] 102.1 kg (04/11 0111) Last BM Date: 10/20/19  Intake/Output from previous day: 04/10 0701 - 04/11 0700 In: 1170.7 [I.V.:1120.7; IV Piggyback:50] Out: -  Intake/Output this shift: No intake/output data recorded.  General appearance: alert and cooperative Resp: clear to auscultation bilaterally Cardio: regular rate and rhythm GI: soft, nontender. chronically incarcerated left inguinal hernia containting fat. no abd distension  Lab Results:  Recent Labs    10/20/19 2010 10/21/19 0319  WBC 12.3* 9.8  HGB 12.7* 11.8*  HCT 39.3 37.2*  PLT 248 235   BMET Recent Labs    10/20/19 2010 10/21/19 0319  NA 137 138  K 3.3* 3.1*  CL 101 102  CO2 26 26  GLUCOSE 141* 112*  BUN 10 9  CREATININE 1.37* 0.91  CALCIUM 8.8* 8.5*   PT/INR No results for input(s): LABPROT, INR in the last 72 hours. ABG No results for input(s): PHART, HCO3 in the last 72 hours.  Invalid input(s): PCO2, PO2  Studies/Results: CT ABDOMEN PELVIS W CONTRAST  Result Date: 10/20/2019 CLINICAL DATA:  34 year old male with left testicular swelling and bleeding. EXAM: CT ABDOMEN AND PELVIS WITH CONTRAST TECHNIQUE: Multidetector CT imaging of the abdomen and pelvis was performed using the standard protocol following bolus administration of intravenous contrast. CONTRAST:  OMNIPAQUE IOHEXOL 300 MG/ML  SOLN COMPARISON:  Pelvic ultrasound dated 12/22/2016. FINDINGS: Lower chest: The visualized lung bases are clear. No intra-abdominal free air or free fluid. Hepatobiliary: There is a 4.2 x 3.2 cm rounded soft tissue structure arising  from the inferior aspect of the caudate lobe of the liver (28/4 and 63/7). This demonstrates similar enhancement as the background liver parenchyma and may represent a normal liver tissue or less likely a focal nodular hyperplasia. There is no intrahepatic biliary ductal dilatation. The gallbladder is unremarkable. Pancreas: Unremarkable. No pancreatic ductal dilatation or surrounding inflammatory changes. Spleen: Normal in size without focal abnormality. Adrenals/Urinary Tract: Adrenal glands are unremarkable. Kidneys are normal, without renal calculi, focal lesion, or hydronephrosis. Bladder is unremarkable. Stomach/Bowel: There is no bowel obstruction or active inflammation. The appendix is not visualized with certainty. No inflammatory changes identified in the right lower quadrant. Vascular/Lymphatic: The abdominal aorta and IVC are unremarkable. No portal venous gas. There is no adenopathy. Reproductive: The prostate and seminal vesicles are grossly unremarkable. There is a moderate size fat containing left inguinal hernia with associated mass effect on the left scrotum. There is minimal stranding of the inferior aspect of the herniated fat which may represent mild edema. No fluid collection. Other: Small fat containing right paraumbilical hernia. Musculoskeletal: No acute or significant osseous findings. IMPRESSION: 1. Moderate size fat containing left inguinal hernia with associated mass effect on the left scrotum. There is minimal stranding of the inferior aspect of the herniated fat which may represent mild edema. No fluid collection. 2. A 4.2 x 3.2 cm rounded soft tissue from the inferior aspect of the caudate lobe of the liver may represent a normal liver tissue or less likely a focal nodular hyperplasia. MRI may provide better evaluation if clinically indicated. Electronically Signed   By: Burtis Junes  Radparvar M.D.   On: 10/20/2019 22:41    Anti-infectives: Anti-infectives (From admission, onward)    Start     Dose/Rate Route Frequency Ordered Stop   10/21/19 0800  ceFAZolin (ANCEF) IVPB 2g/100 mL premix     2 g 200 mL/hr over 30 Minutes Intravenous To [facility] 10/20/19 2353 10/22/19 0800      Assessment/Plan: s/p Procedure(s): OPEN HERNIA REPAIR INGUINAL (Left) would benefit from repair of hernia during this admission. will discuss timing with the team  LOS: 0 days    Autumn Messing III 10/21/2019

## 2019-10-21 NOTE — Progress Notes (Signed)
Patient admitted to 6N29  from ED. Alert and oriented x4. Denies  c/o pain. VS stable. Oriented to room and remote. Will continue to monitor.

## 2019-10-21 NOTE — OR Nursing (Signed)
Case was canceled after supplies and instruments were already opened to the sterile field in the operating room.

## 2019-10-21 NOTE — Plan of Care (Signed)
  Problem: Education: Goal: Knowledge of General Education information will improve Description Including pain rating scale, medication(s)/side effects and non-pharmacologic comfort measures Outcome: Progressing   

## 2019-10-22 ENCOUNTER — Encounter (HOSPITAL_COMMUNITY): Payer: Self-pay

## 2019-10-22 ENCOUNTER — Observation Stay (HOSPITAL_COMMUNITY): Payer: Self-pay | Admitting: Certified Registered Nurse Anesthetist

## 2019-10-22 ENCOUNTER — Encounter (HOSPITAL_COMMUNITY)
Admission: EM | Disposition: A | Discharge status: Home / Self Care | Payer: Self-pay | Source: Home / Self Care | Attending: Emergency Medicine

## 2019-10-22 HISTORY — PX: INSERTION OF MESH: SHX5868

## 2019-10-22 HISTORY — PX: INGUINAL HERNIA REPAIR: SHX194

## 2019-10-22 SURGERY — REPAIR, HERNIA, INGUINAL, ADULT
Anesthesia: Choice | Laterality: Left

## 2019-10-22 SURGERY — REPAIR, HERNIA, INGUINAL, ADULT
Anesthesia: General | Site: Inguinal | Laterality: Left

## 2019-10-22 MED ORDER — PHENYLEPHRINE 40 MCG/ML (10ML) SYRINGE FOR IV PUSH (FOR BLOOD PRESSURE SUPPORT)
PREFILLED_SYRINGE | INTRAVENOUS | Status: DC | PRN
  Administered 2019-10-22: 80 ug via INTRAVENOUS

## 2019-10-22 MED ORDER — HYDROMORPHONE HCL 1 MG/ML IJ SOLN
INTRAMUSCULAR | Status: DC | PRN
  Administered 2019-10-22 (×2): .25 mg via INTRAVENOUS

## 2019-10-22 MED ORDER — MORPHINE SULFATE (PF) 2 MG/ML IV SOLN: 2.0000 mg | INTRAVENOUS | Status: DC | PRN

## 2019-10-22 MED ORDER — KCL IN DEXTROSE-NACL 20-5-0.45 MEQ/L-%-% IV SOLN: INTRAVENOUS | Status: DC

## 2019-10-22 MED ORDER — PROPOFOL 10 MG/ML IV BOLUS
INTRAVENOUS | Status: AC
  Filled 2019-10-22: qty 40

## 2019-10-22 MED ORDER — OXYCODONE HCL 5 MG PO TABS
5.0000 mg | ORAL_TABLET | Freq: Once | ORAL | Status: AC | PRN
  Administered 2019-10-22: 5 mg via ORAL

## 2019-10-22 MED ORDER — IBUPROFEN 200 MG PO TABS: 600.0000 mg | ORAL_TABLET | Freq: Three times a day (TID) | ORAL | Status: AC | PRN

## 2019-10-22 MED ORDER — PROPOFOL 10 MG/ML IV BOLUS
INTRAVENOUS | Status: DC | PRN
  Administered 2019-10-22: 200 mg via INTRAVENOUS

## 2019-10-22 MED ORDER — SUGAMMADEX SODIUM 200 MG/2ML IV SOLN
INTRAVENOUS | Status: DC | PRN
  Administered 2019-10-22: 200 mg via INTRAVENOUS

## 2019-10-22 MED ORDER — PROMETHAZINE HCL 25 MG/ML IJ SOLN: 6.2500 mg | INTRAMUSCULAR | Status: DC | PRN

## 2019-10-22 MED ORDER — OXYCODONE HCL 5 MG PO TABS: 5.0000 mg | ORAL_TABLET | ORAL | 0 refills | Status: AC | PRN

## 2019-10-22 MED ORDER — ROCURONIUM BROMIDE 10 MG/ML (PF) SYRINGE
PREFILLED_SYRINGE | INTRAVENOUS | Status: AC
  Filled 2019-10-22: qty 10

## 2019-10-22 MED ORDER — FENTANYL CITRATE (PF) 100 MCG/2ML IJ SOLN
25.0000 ug | INTRAMUSCULAR | Status: DC | PRN
  Administered 2019-10-22 (×2): 50 ug via INTRAVENOUS

## 2019-10-22 MED ORDER — MUPIROCIN 2 % EX OINT: 1.0000 "application " | TOPICAL_OINTMENT | Freq: Two times a day (BID) | CUTANEOUS | Status: DC

## 2019-10-22 MED ORDER — HYDROMORPHONE HCL 1 MG/ML IJ SOLN
INTRAMUSCULAR | Status: AC
  Filled 2019-10-22: qty 0.5

## 2019-10-22 MED ORDER — LIDOCAINE 2% (20 MG/ML) 5 ML SYRINGE
INTRAMUSCULAR | Status: AC
  Filled 2019-10-22: qty 5

## 2019-10-22 MED ORDER — FENTANYL CITRATE (PF) 100 MCG/2ML IJ SOLN
INTRAMUSCULAR | Status: AC
  Filled 2019-10-22: qty 2

## 2019-10-22 MED ORDER — NYSTATIN 100000 UNIT/GM EX POWD: Freq: Two times a day (BID) | CUTANEOUS | 0 refills | Status: AC

## 2019-10-22 MED ORDER — BUPIVACAINE HCL (PF) 0.25 % IJ SOLN
INTRAMUSCULAR | Status: AC
  Filled 2019-10-22: qty 30

## 2019-10-22 MED ORDER — PHENYLEPHRINE 40 MCG/ML (10ML) SYRINGE FOR IV PUSH (FOR BLOOD PRESSURE SUPPORT)
PREFILLED_SYRINGE | INTRAVENOUS | Status: AC
  Filled 2019-10-22: qty 10

## 2019-10-22 MED ORDER — KETOROLAC TROMETHAMINE 30 MG/ML IJ SOLN
INTRAMUSCULAR | Status: AC
  Filled 2019-10-22: qty 1

## 2019-10-22 MED ORDER — ROCURONIUM BROMIDE 10 MG/ML (PF) SYRINGE
PREFILLED_SYRINGE | INTRAVENOUS | Status: DC | PRN
  Administered 2019-10-22: 30 mg via INTRAVENOUS
  Administered 2019-10-22 (×2): 20 mg via INTRAVENOUS

## 2019-10-22 MED ORDER — 0.9 % SODIUM CHLORIDE (POUR BTL) OPTIME
TOPICAL | Status: DC | PRN
  Administered 2019-10-22: 500 mL

## 2019-10-22 MED ORDER — IBUPROFEN 600 MG PO TABS
600.0000 mg | ORAL_TABLET | Freq: Three times a day (TID) | ORAL | Status: DC
  Administered 2019-10-22 (×2): 600 mg via ORAL
  Filled 2019-10-22 (×2): qty 1

## 2019-10-22 MED ORDER — OXYCODONE HCL 5 MG PO TABS: 5.0000 mg | ORAL_TABLET | ORAL | Status: DC | PRN

## 2019-10-22 MED ORDER — MIDAZOLAM HCL 2 MG/2ML IJ SOLN
INTRAMUSCULAR | Status: AC
  Filled 2019-10-22: qty 2

## 2019-10-22 MED ORDER — OXYCODONE HCL 5 MG PO TABS
ORAL_TABLET | ORAL | Status: AC
  Filled 2019-10-22: qty 1

## 2019-10-22 MED ORDER — SUCCINYLCHOLINE CHLORIDE 200 MG/10ML IV SOSY
PREFILLED_SYRINGE | INTRAVENOUS | Status: DC | PRN
  Administered 2019-10-22: 140 mg via INTRAVENOUS

## 2019-10-22 MED ORDER — CHLORHEXIDINE GLUCONATE CLOTH 2 % EX PADS: 6.0000 | MEDICATED_PAD | Freq: Every day | CUTANEOUS | Status: DC

## 2019-10-22 MED ORDER — LACTATED RINGERS IV SOLN: INTRAVENOUS | Status: DC | PRN

## 2019-10-22 MED ORDER — FENTANYL CITRATE (PF) 250 MCG/5ML IJ SOLN
INTRAMUSCULAR | Status: AC
  Filled 2019-10-22: qty 5

## 2019-10-22 MED ORDER — LIDOCAINE 2% (20 MG/ML) 5 ML SYRINGE
INTRAMUSCULAR | Status: DC | PRN
  Administered 2019-10-22: 100 mg via INTRAVENOUS

## 2019-10-22 MED ORDER — SUCCINYLCHOLINE CHLORIDE 200 MG/10ML IV SOSY
PREFILLED_SYRINGE | INTRAVENOUS | Status: AC
  Filled 2019-10-22: qty 10

## 2019-10-22 MED ORDER — FENTANYL CITRATE (PF) 250 MCG/5ML IJ SOLN
INTRAMUSCULAR | Status: DC | PRN
  Administered 2019-10-22: 100 ug via INTRAVENOUS
  Administered 2019-10-22 (×3): 50 ug via INTRAVENOUS

## 2019-10-22 MED ORDER — ACETAMINOPHEN 500 MG PO TABS
1000.0000 mg | ORAL_TABLET | Freq: Four times a day (QID) | ORAL | Status: DC
  Administered 2019-10-22 (×2): 1000 mg via ORAL
  Filled 2019-10-22 (×2): qty 2

## 2019-10-22 MED ORDER — KETOROLAC TROMETHAMINE 30 MG/ML IJ SOLN
30.0000 mg | Freq: Once | INTRAMUSCULAR | Status: AC | PRN
  Administered 2019-10-22: 30 mg via INTRAVENOUS

## 2019-10-22 MED ORDER — BUPIVACAINE HCL 0.25 % IJ SOLN
INTRAMUSCULAR | Status: DC | PRN
  Administered 2019-10-22: 20 mL

## 2019-10-22 MED ORDER — MIDAZOLAM HCL 5 MG/5ML IJ SOLN
INTRAMUSCULAR | Status: DC | PRN
  Administered 2019-10-22: 2 mg via INTRAVENOUS

## 2019-10-22 MED ORDER — ACETAMINOPHEN 500 MG PO TABS: 1000.0000 mg | ORAL_TABLET | Freq: Four times a day (QID) | ORAL | Status: AC | PRN

## 2019-10-22 MED ORDER — OXYCODONE HCL 5 MG/5ML PO SOLN: 5.0000 mg | Freq: Once | ORAL | Status: AC | PRN

## 2019-10-22 MED ORDER — ONDANSETRON HCL 4 MG/2ML IJ SOLN
INTRAMUSCULAR | Status: AC
  Filled 2019-10-22: qty 2

## 2019-10-22 MED ORDER — DEXAMETHASONE SODIUM PHOSPHATE 10 MG/ML IJ SOLN
INTRAMUSCULAR | Status: DC | PRN
  Administered 2019-10-22: 10 mg via INTRAVENOUS

## 2019-10-22 SURGICAL SUPPLY — 53 items
CANISTER SUCT 3000ML PPV (MISCELLANEOUS) IMPLANT
COVER SURGICAL LIGHT HANDLE (MISCELLANEOUS) ×4 IMPLANT
COVER WAND RF STERILE (DRAPES) ×4 IMPLANT
DEFOGGER SCOPE WARMER CLEARIFY (MISCELLANEOUS) IMPLANT
DERMABOND ADVANCED (GAUZE/BANDAGES/DRESSINGS) ×2
DERMABOND ADVANCED .7 DNX12 (GAUZE/BANDAGES/DRESSINGS) ×2 IMPLANT
DISSECTOR BLUNT TIP ENDO 5MM (MISCELLANEOUS) IMPLANT
DRAIN PENROSE 1/2X12 LTX STRL (WOUND CARE) ×2 IMPLANT
ELECT CAUTERY BLADE 6.4 (BLADE) ×2 IMPLANT
ELECT REM PT RETURN 9FT ADLT (ELECTROSURGICAL) ×4
ELECTRODE REM PT RTRN 9FT ADLT (ELECTROSURGICAL) ×2 IMPLANT
GLOVE BIO SURGEON STRL SZ7.5 (GLOVE) ×4 IMPLANT
GOWN STRL REUS W/ TWL LRG LVL3 (GOWN DISPOSABLE) ×4 IMPLANT
GOWN STRL REUS W/ TWL XL LVL3 (GOWN DISPOSABLE) ×2 IMPLANT
GOWN STRL REUS W/TWL LRG LVL3 (GOWN DISPOSABLE) ×4
GOWN STRL REUS W/TWL XL LVL3 (GOWN DISPOSABLE) ×2
KIT BASIN OR (CUSTOM PROCEDURE TRAY) ×4 IMPLANT
KIT TURNOVER KIT B (KITS) ×4 IMPLANT
MESH PARIETEX PROGRIP LEFT (Mesh General) ×2 IMPLANT
NDL INSUFFLATION 14GA 120MM (NEEDLE) IMPLANT
NEEDLE INSUFFLATION 14GA 120MM (NEEDLE) IMPLANT
NS IRRIG 1000ML POUR BTL (IV SOLUTION) ×4 IMPLANT
PAD ARMBOARD 7.5X6 YLW CONV (MISCELLANEOUS) ×8 IMPLANT
PENCIL SMOKE EVACUATOR (MISCELLANEOUS) ×2 IMPLANT
RELOAD STAPLE 4.0 BLU F/HERNIA (INSTRUMENTS) ×2 IMPLANT
RELOAD STAPLE 4.8 BLK F/HERNIA (STAPLE) IMPLANT
RELOAD STAPLE HERNIA 4.0 BLUE (INSTRUMENTS) IMPLANT
RELOAD STAPLE HERNIA 4.8 BLK (STAPLE) IMPLANT
SCISSORS LAP 5X35 DISP (ENDOMECHANICALS) ×2 IMPLANT
SET IRRIG TUBING LAPAROSCOPIC (IRRIGATION / IRRIGATOR) IMPLANT
SET TUBE SMOKE EVAC HIGH FLOW (TUBING) ×4 IMPLANT
STAPLER HERNIA 12 8.5 360D (INSTRUMENTS) ×2 IMPLANT
SUT MNCRL AB 4-0 PS2 18 (SUTURE) ×4 IMPLANT
SUT PROLENE 2 0 SH DA (SUTURE) ×2 IMPLANT
SUT SILK 0 SH 30 (SUTURE) ×2 IMPLANT
SUT VIC AB 1 CT1 27 (SUTURE)
SUT VIC AB 1 CT1 27XBRD ANBCTR (SUTURE) IMPLANT
SUT VIC AB 2-0 SH 27 (SUTURE) ×4
SUT VIC AB 2-0 SH 27XBRD (SUTURE) IMPLANT
SUT VIC AB 3-0 SH 27 (SUTURE) ×4
SUT VIC AB 3-0 SH 27X BRD (SUTURE) IMPLANT
SYRINGE TOOMEY DISP (SYRINGE) ×4 IMPLANT
TOWEL GREEN STERILE (TOWEL DISPOSABLE) ×4 IMPLANT
TOWEL GREEN STERILE FF (TOWEL DISPOSABLE) ×4 IMPLANT
TRAY FOLEY W/BAG SLVR 14FR (SET/KITS/TRAYS/PACK) ×4 IMPLANT
TRAY LAPAROSCOPIC MC (CUSTOM PROCEDURE TRAY) ×4 IMPLANT
TROCAR OPTICAL SHORT 5MM (TROCAR) ×2 IMPLANT
TROCAR OPTICAL SLV SHORT 5MM (TROCAR) ×2 IMPLANT
TROCAR XCEL 12X100 BLDLESS (ENDOMECHANICALS) ×2 IMPLANT
TUBE CONNECTING 12'X1/4 (SUCTIONS) ×1
TUBE CONNECTING 12X1/4 (SUCTIONS) ×1 IMPLANT
WATER STERILE IRR 1000ML POUR (IV SOLUTION) ×4 IMPLANT
YANKAUER SUCT BULB TIP NO VENT (SUCTIONS) ×2 IMPLANT

## 2019-10-22 NOTE — Discharge Instructions (Signed)
[facility] _______[facility] ° °UMBILICAL OR INGUINAL HERNIA REPAIR: POST OP INSTRUCTIONS ° °Always review your discharge instruction sheet given to you by the facility where your surgery was performed. °IF YOU HAVE DISABILITY OR FAMILY LEAVE FORMS, YOU MUST BRING THEM TO THE OFFICE FOR PROCESSING.   °DO NOT GIVE THEM TO YOUR DOCTOR. ° °1. A  prescription for pain medication may be given to you upon discharge.  Take your pain medication as prescribed, if needed.  If narcotic pain medicine is not needed, then you may take acetaminophen (Tylenol) or ibuprofen (Advil) as needed. °2. Take your usually prescribed medications unless otherwise directed. °If you need a refill on your pain medication, please contact your pharmacy.  They will contact our office to request authorization. Prescriptions will not be filled after 5 pm or on week-ends. °3. You should follow a light diet the first 24 hours after arrival home, such as soup and crackers, etc.  Be sure to include lots of fluids daily.  Resume your normal diet the day after surgery. °4.Most patients will experience some swelling and bruising around the umbilicus or in the groin and scrotum.  Ice packs and reclining will help.  Swelling and bruising can take several days to resolve.  °6. It is common to experience some constipation if taking pain medication after surgery.  Increasing fluid intake and taking a stool softener (such as Colace) will usually help or prevent this problem from occurring.  A mild laxative (Milk of Magnesia or Miralax) should be taken according to package directions if there are no bowel movements after 48 hours. °7. Unless discharge instructions indicate otherwise, you may remove your bandages 24-48 hours after surgery, and you may shower at that time.  You may have steri-strips (small skin tapes) in place directly over the incision.  These strips should be left on the skin for 7-10 days.  If your surgeon used skin glue on the  incision, you may shower in 24 hours.  The glue will flake off over the next 2-3 weeks.  Any sutures or staples will be removed at the office during your follow-up visit. °8. ACTIVITIES:  You may resume regular (light) daily activities beginning the next day--such as daily self-care, walking, climbing stairs--gradually increasing activities as tolerated.  You may have sexual intercourse when it is comfortable.  Refrain from any heavy lifting or straining until approved by your doctor. ° °a.You may drive when you are no longer taking prescription pain medication, you can comfortably wear a seatbelt, and you can safely maneuver your car and apply brakes. ° °9.You should see your doctor in the office for a follow-up appointment approximately 2-3 weeks after your surgery.  Make sure that you call for this appointment within a day or two after you arrive home to insure a convenient appointment time. ° ° °WHEN TO CALL YOUR DOCTOR: °1. Fever over 101.0 °2. Inability to urinate °3. Nausea and/or vomiting °4. Extreme swelling or bruising °5. Continued bleeding from incision. °6. Increased pain, redness, or drainage from the incision ° °The clinic staff is available to answer your questions during regular business hours.  Please don’t hesitate to call and ask to speak to one of the nurses for clinical concerns.  If you have a medical emergency, go to the nearest emergency room or call 911.  A surgeon from [facility] is always on call at the hospital ° ° °[address], [address], [place], [place]  [address] ? °   [address] [address], [place], [place]   [address] °[phone] ? [phone] ? FAX [phone] °Web site: [email or web address] ° °••••••••• ° ° °Managing Your Pain After Surgery Without Opioids ° ° ° °Thank you for participating in our program to help patients manage their pain after surgery without opioids. This is part of our effort to provide you with the best care possible, without  exposing you or your family to the risk that opioids pose. ° °What pain can I expect after surgery? °You can expect to have some pain after surgery. This is normal. The pain is typically worse the day after surgery, and quickly begins to get better. °Many studies have found that many patients are able to manage their pain after surgery with Over-the-Counter (OTC) medications such as Tylenol and Motrin. If you have a condition that does not allow you to take Tylenol or Motrin, notify your surgical team. ° °How will I manage my pain? °The best strategy for controlling your pain after surgery is around the clock pain control with Tylenol (acetaminophen) and Motrin (ibuprofen or Advil). Alternating these medications with each other allows you to maximize your pain control. In addition to Tylenol and Motrin, you can use heating pads or ice packs on your incisions to help reduce your pain. ° °How will I alternate your regular strength over-the-counter pain medication? °You will take a dose of pain medication every three hours. °; Start by taking 650 mg of Tylenol (2 pills of 325 mg) °; 3 hours later take 600 mg of Motrin (3 pills of 200 mg) °; 3 hours after taking the Motrin take 650 mg of Tylenol °; 3 hours after that take 600 mg of Motrin. ° ° °- 1 - ° °See example - if your first dose of Tylenol is at 12:00 PM ° ° °12:00 PM Tylenol 650 mg (2 pills of 325 mg)  °3:00 PM Motrin 600 mg (3 pills of 200 mg)  °6:00 PM Tylenol 650 mg (2 pills of 325 mg)  °9:00 PM Motrin 600 mg (3 pills of 200 mg)  °Continue alternating every 3 hours  ° °We recommend that you follow this schedule around-the-clock for at least 3 days after surgery, or until you feel that it is no longer needed. Use the table on the last page of this handout to keep track of the medications you are taking. °Important: °Do not take more than 3000mg of Tylenol or 3200mg of Motrin in a 24-hour period. °Do not take ibuprofen/Motrin if you have a history of bleeding  stomach ulcers, severe kidney disease, &/or actively taking a blood thinner ° °What if I still have pain? °If you have pain that is not controlled with the over-the-counter pain medications (Tylenol and Motrin or Advil) you might have what we call “breakthrough” pain. You will receive a prescription for a small amount of an opioid pain medication such as Oxycodone, Tramadol, or Tylenol with Codeine. Use these opioid pills in the first 24 hours after surgery if you have breakthrough pain. Do not take more than 1 pill every 4-6 hours. ° °If you still have uncontrolled pain after using all opioid pills, don't hesitate to call our staff using the number provided. We will help make sure you are managing your pain in the best way possible, and if necessary, we can provide a prescription for additional pain medication. ° ° °Day 1   ° °Time  °Name of Medication Number of pills taken  °Amount of Acetaminophen  °Pain Level  ° °  Comments  °AM PM       °AM PM       °AM PM       °AM PM       °AM PM       °AM PM       °AM PM       °AM PM       °Total Daily amount of Acetaminophen °Do not take more than  3,000 mg per day    ° ° °Day 2   ° °Time  °Name of Medication Number of pills °taken  °Amount of Acetaminophen  °Pain Level  ° °Comments  °AM PM       °AM PM       °AM PM       °AM PM       °AM PM       °AM PM       °AM PM       °AM PM       °Total Daily amount of Acetaminophen °Do not take more than  3,000 mg per day    ° ° °Day 3   ° °Time  °Name of Medication Number of pills taken  °Amount of Acetaminophen  °Pain Level  ° °Comments  °AM PM       °AM PM       °AM PM       °AM PM       ° ° ° °AM PM       °AM PM       °AM PM       °AM PM       °Total Daily amount of Acetaminophen °Do not take more than  3,000 mg per day    ° ° °Day 4   ° °Time  °Name of Medication Number of pills taken  °Amount of Acetaminophen  °Pain Level  ° °Comments  °AM PM       °AM PM       °AM PM       °AM PM       °AM PM       °AM PM       °AM PM       °AM  PM       °Total Daily amount of Acetaminophen °Do not take more than  3,000 mg per day    ° ° °Day 5   ° °Time  °Name of Medication Number °of pills taken  °Amount of Acetaminophen  °Pain Level  ° °Comments  °AM PM       °AM PM       °AM PM       °AM PM       °AM PM       °AM PM       °AM PM       °AM PM       °Total Daily amount of Acetaminophen °Do not take more than  3,000 mg per day    ° ° ° °Day 6   ° °Time  °Name of Medication Number of pills °taken  °Amount of Acetaminophen  °Pain Level  °Comments  °AM PM       °AM PM       °AM PM       °AM PM       °AM PM       °AM PM       °AM   PM       °AM PM       °Total Daily amount of Acetaminophen °Do not take more than  3,000 mg per day    ° ° °Day 7   ° °Time  °Name of Medication Number of pills taken  °Amount of Acetaminophen  °Pain Level  ° °Comments  °AM PM       °AM PM       °AM PM       °AM PM       °AM PM       °AM PM       °AM PM       °AM PM       °Total Daily amount of Acetaminophen °Do not take more than  3,000 mg per day    ° ° ° ° °For additional information about how and where to safely dispose of unused opioid °medications - https://www.morepowerfulnc.org ° °Disclaimer: This document contains information and/or instructional materials adapted from [facility] for the typical patient with your condition. It does not replace medical advice from your health care provider because your experience may differ from that of the °typical patient. Talk to your health care provider if you have any questions about this °document, your condition or your treatment plan. °Adapted from [facility] ° °

## 2019-10-22 NOTE — Discharge Summary (Signed)
    Patient ID: Steven Wang [id number] 05-Sep-1985 34 y.o.  Admit date: 10/20/2019 Discharge date: 10/22/2019  Admitting Diagnosis: Left inguinal hernia  Discharge Diagnosis Patient Active Problem List   Diagnosis Date Noted  . Left inguinal hernia 10/20/2019  s/p [facility] repair  Consultants none  Reason for Admission: 34 yo male with enlarged left scrotal swelling. He had this problem 2 years ago and was having it worked up but then it got smaller and went away. In the last week it has come back. He had some bleeding of his scrotum so came to the ED. He has some pain from the area and his left groin. Pain is worse with movement and lifting. It does not radiate. It is better with rest. He denies nausea or vomiting.  Procedures LIH repair, Dr. Derrell Lolling 4/12  Hospital Course:  The patient was admitted and underwent a LIH repair.  The patient tolerated the procedure well.  On POD 0, the patient was tolerating a diet, voiding well, mobilizing, and pain was controlled with oral pain medications.  The patient was stable for DC home at this time with appropriate follow up made.   Physical Exam: Abd: soft, minimally tender, incision in left groin c/d/i with dermabond, +BS  Allergies as of 10/22/2019   No Known Allergies     Medication List    TAKE these medications   acetaminophen 500 MG tablet Commonly known as: TYLENOL Take 2 tablets (1,000 mg total) by mouth every 6 (six) hours as needed for mild pain.   ibuprofen 200 MG tablet Commonly known as: ADVIL Take 3 tablets (600 mg total) by mouth every 8 (eight) hours as needed for mild pain.   Mucinex Sinus-Max Day/Night 5-200-325 & 5- 12.5-325MG  Misc Generic drug: Phenyleph-Diphenhyd-GG-APAP Take 1 capsule by mouth every 6 (six) hours as needed (for head congestion).   nystatin powder Commonly known as: MYCOSTATIN/NYSTOP Apply topically 2 (two) times daily.   oxyCODONE 5 MG immediate release tablet Commonly known as: Oxy  IR/ROXICODONE Take 1 tablet (5 mg total) by mouth every 4 (four) hours as needed for moderate pain.        Follow-up Information    [facility], [facility]. Go on 11/13/2019.   Specialty: General Surgery Why: Follow up appointment scheduled for 9:15 AM. Please arrive 30 min prior to appointment time. Bring photo ID and insurance information.  Contact information: [address] [place] [address] [phone]           Signed: Barnetta Chapel, [facility] 10/22/2019, 2:37 PM Please see Amion for pager number during day hours 7:00am-4:30pm, 7-11:30am on Weekends

## 2019-10-22 NOTE — Transfer of Care (Signed)
Immediate Anesthesia Transfer of Care Note  Patient: Steven Wang  Procedure(s) Performed: Left Hernia Repair Inguinal Adult (Left Inguinal) Insertion Of Mesh (Left Inguinal)  Patient Location: PACU  Anesthesia Type:General  Level of Consciousness: awake, alert  and oriented  Airway & Oxygen Therapy: Patient Spontanous Breathing  Post-op Assessment: Report given to RN and Post -op Vital signs reviewed and stable  Post vital signs: Reviewed and stable  Last Vitals:  Vitals Value Taken Time  BP 138/96 10/22/19 0950  Temp    Pulse 99 10/22/19 0951  Resp 16 10/22/19 0951  SpO2 95 % 10/22/19 0951  Vitals shown include unvalidated device data.  Last Pain:  Vitals:   10/22/19 0509  TempSrc: Oral  PainSc:          Complications: No apparent anesthesia complications

## 2019-10-22 NOTE — Anesthesia Preprocedure Evaluation (Addendum)
Anesthesia Evaluation  Patient identified by MRN, date of birth, ID band Patient awake    Reviewed: Allergy & Precautions, NPO status , Patient's Chart, lab work & pertinent test results  Airway Mallampati: II  TM Distance: >3 FB Neck ROM: Full    Dental no notable dental hx. (+) Teeth Intact, Dental Advisory Given   Pulmonary Current Smoker,    Pulmonary exam normal breath sounds clear to auscultation       Cardiovascular negative cardio ROS Normal cardiovascular exam Rhythm:Regular Rate:Normal     Neuro/Psych negative neurological ROS  negative psych ROS   GI/Hepatic negative GI ROS, Neg liver ROS,   Endo/Other  obesity  Renal/GU negative Renal ROS  negative genitourinary   Musculoskeletal negative musculoskeletal ROS (+)   Abdominal   Peds negative pediatric ROS (+)  Hematology negative hematology ROS (+)   Anesthesia Other Findings   Reproductive/Obstetrics negative OB ROS                            Anesthesia Physical Anesthesia Plan  ASA: II  Anesthesia Plan: General   Post-op Pain Management:    Induction: Intravenous  PONV Risk Score and Plan: 2 and Ondansetron, Dexamethasone and Treatment may vary due to age or medical condition  Airway Management Planned: Oral ETT  Additional Equipment:   Intra-op Plan:   Post-operative Plan: Extubation in OR  Informed Consent: I have reviewed the patients History and Physical, chart, labs and discussed the procedure including the risks, benefits and alternatives for the proposed anesthesia with the patient or authorized representative who has indicated his/her understanding and acceptance.     Dental advisory given  Plan Discussed with: CRNA and Surgeon  Anesthesia Plan Comments:         Anesthesia Quick Evaluation

## 2019-10-22 NOTE — Anesthesia Postprocedure Evaluation (Signed)
Anesthesia Post Note  Patient: Steven Wang  Procedure(s) Performed: Left Hernia Repair Inguinal Adult (Left Inguinal) Insertion Of Mesh (Left Inguinal)     Patient location during evaluation: PACU Anesthesia Type: General Level of consciousness: awake and alert Pain management: pain level controlled Vital Signs Assessment: post-procedure vital signs reviewed and stable Respiratory status: spontaneous breathing, nonlabored ventilation, respiratory function stable and patient connected to nasal cannula oxygen Cardiovascular status: blood pressure returned to baseline and stable Postop Assessment: no apparent nausea or vomiting Anesthetic complications: no    Last Vitals:  Vitals:   10/22/19 0509 10/22/19 0950  BP: 129/74 (!) 138/96  Pulse: 85 98  Resp: 18 (!) 21  Temp: 36.7 C (!) 36.3 C  SpO2: 98% 95%    Last Pain:  Vitals:   10/22/19 1000  TempSrc:   PainSc: 6                  Alec Jaros S

## 2019-10-22 NOTE — Progress Notes (Signed)
Discharged patient to home, AVS given and explained. Questions answered to satisfaction, belongings returned accordingly.

## 2019-10-22 NOTE — Anesthesia Procedure Notes (Signed)
Procedure Name: Intubation Date/Time: 10/22/2019 8:04 AM Performed by: Nils Pyle, CRNA Pre-anesthesia Checklist: Patient identified, Emergency Drugs available, Suction available and Patient being monitored Patient Re-evaluated:Patient Re-evaluated prior to induction Oxygen Delivery Method: Circle System Utilized Preoxygenation: Pre-oxygenation with 100% oxygen Induction Type: IV induction and Rapid sequence Ventilation: Mask ventilation with difficulty and Oral airway inserted - appropriate to patient size Laryngoscope Size: Hyacinth Meeker and 2 Grade View: Grade I Tube type: Oral Tube size: 7.5 mm Number of attempts: 1 Airway Equipment and Method: Stylet and Oral airway Placement Confirmation: ETT inserted through vocal cords under direct vision,  positive ETCO2 and breath sounds checked- equal and bilateral Secured at: 23 cm Tube secured with: Tape Dental Injury: Teeth and Oropharynx as per pre-operative assessment

## 2019-10-22 NOTE — Op Note (Signed)
10/22/2019  9:34 AM  PATIENT:  Steven Wang  34 y.o. male  PRE-OPERATIVE DIAGNOSIS:  LEFT INCARCERATED INGUINAL HERNIA  POST-OPERATIVE DIAGNOSIS:  LEFT INCARCERATED INGUINAL HERNIA  PROCEDURE:  Procedure(s): Left Hernia Repair Inguinal Adult (Left) Insertion Of Mesh (Left)  SURGEON:  Surgeon(s) and Role:    Axel Filler, MD - Primary   ANESTHESIA:   local and general  EBL:  30 mL   BLOOD ADMINISTERED:none  DRAINS: none   LOCAL MEDICATIONS USED:  BUPIVICAINE   SPECIMEN:  Source of Specimen:  HERNIA SAC  DISPOSITION OF SPECIMEN:  PATHOLOGY  COUNTS:  YES  TOURNIQUET:  * No tourniquets in log *  DICTATION: .Dragon Dictation Details of the procedure: The patient was taken back to the operating room. The patient was placed in supine position with bilateral SCDs in place. The patient was prepped and draped in the usual sterile fashion.  After appropriate anitbiotics were confirmed, a time-out was confirmed and all facts were verified.  Quarter percent Marcaine was used to infiltrate the area of the incision and an ilioinguinal nerve block was also placed.   A 5 cm incision was made just 1 cm superior to the inguinal ligament. Bovie cautery was used to maintain hemostasis dissection is carried down to the external oblique.  A standard incision was made laterally, and the external oblique was bluntly dissected away from the surrounding tissue with Metzenbaum scissors. The external oblique was elevated in the spermatic cord was bluntly dissected away from the surrounding tissue.  The ilioinguinal nerve was identified and ligated with an 2-0 dyed vicryl.   The spermatic cord and the hernia were then bluntly dissected away from the pubic tubercle and a Penrose was placed around the hernia sac in the spermatic cord. The vas deferens was identified and protected at all portions of the case. Dissection of the cremasterics took place with Bovie cautery. One the hernia sac was  dissected away from the surrrounding cremesteric tissue , the hernia sac was entered laterally. There was a large amount of incarcerated omentum within the hernia sac.  There were no sliding components and no femoral hernia palpated. The hernia sac was highly ligated using 0 Vicryl.  The specimen was sent to pathology. This retracted into the abdomen in the usual fashion.  At this time a Left-sided Progrip mesh was then anchored to the pubic tubercle with a 2-0 Prolene.  It was anchored to the shelving edge of the external oblique x 1 and the conjoint tendon cephalad x 1.  The wrap around of the mesh was sutured to the conjoint tendon as well.  The new internal ring did not strangulate the spermatic cord.   The tail was then tucked under the external oblique. At this time the area was irrigated out with sterile saline.    The external oblique was reapproximated using a 2-0 Vicryl in a running fashion. Scarpa's fascia was then reapproximated using a 3-0 Vicryl running fashion. The skin was then reapproximated with 4 Monocryl in a subcuticular fashion. The skin was then dressed with Dermabond.  The patient was taken to the recovery room in stable condition.      PLAN OF CARE: Admit to inpatient   PATIENT DISPOSITION:  PACU - hemodynamically stable.   Delay start of Pharmacological VTE agent (>24hrs) due to surgical blood loss or risk of bleeding: not applicable

## 2019-10-23 LAB — SURGICAL PATHOLOGY

## 2021-11-26 IMAGING — CT CT ABD-PELV W/ CM
2 of 4 series · 15 of 46 positions shown, 17 images · IV contrast (APPLIED)
Comparison: Pelvic ultrasound dated 12/22/2016.

CLINICAL DATA: 33-year-old male with left testicular swelling and
bleeding.

EXAM:
CT ABDOMEN AND PELVIS WITH CONTRAST
TECHNIQUE: Multidetector CT imaging of the abdomen and pelvis was performed
using the standard protocol following bolus administration of
intravenous contrast.
CONTRAST:  100mL OMNIPAQUE IOHEXOL 300 MG/ML  SOLN

[Series 4: abd/ pelvis 5.0 i30f 2 · axial · 0.98mm/px · z∈[-886,-281]mm · 12 of 139 slices shown, 14 images]
[im 12/139  soft-tissue]
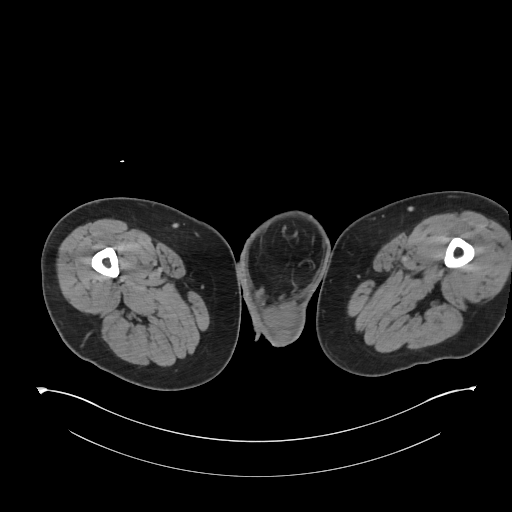
[im 12/139  bone]
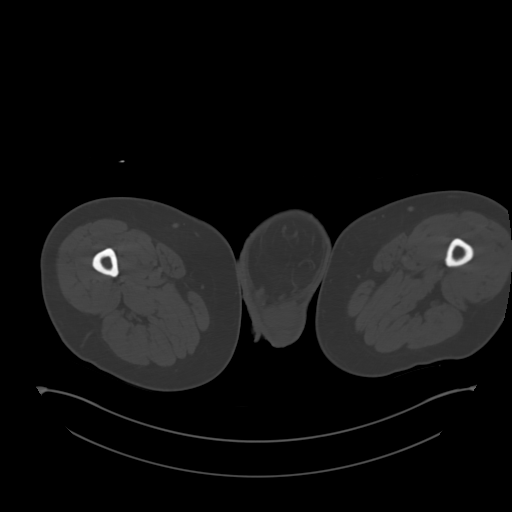
[im 23/139  soft-tissue]
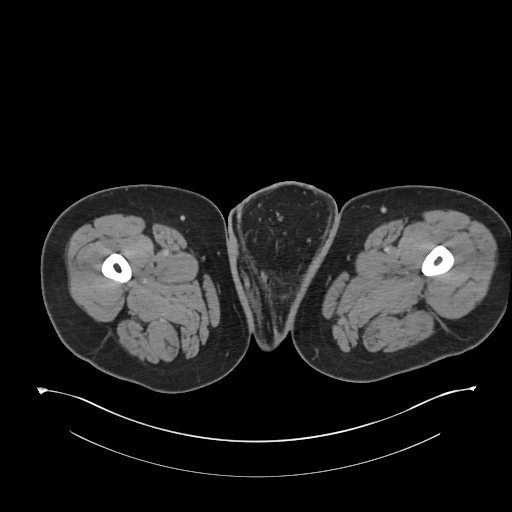
[im 34/139  soft-tissue]
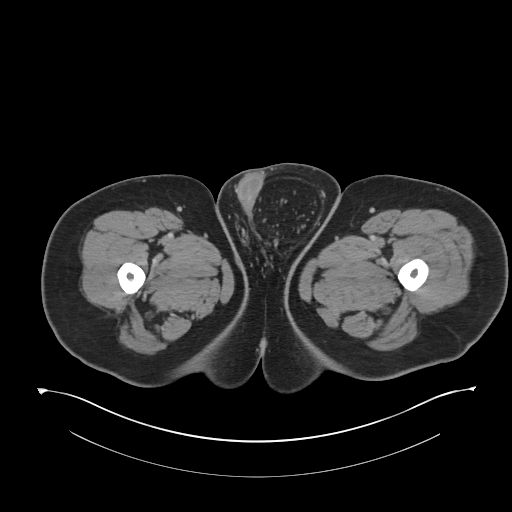
[im 45/139  soft-tissue]
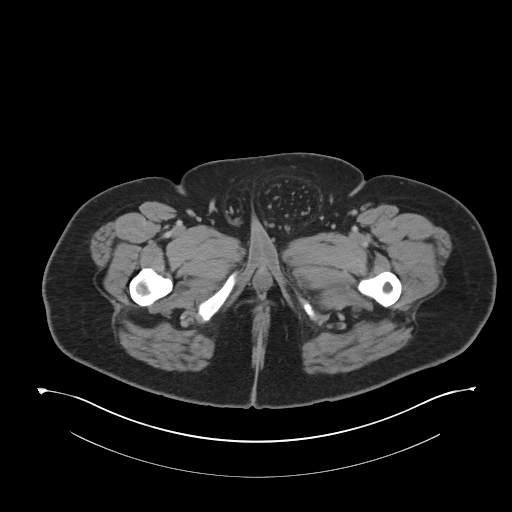
[im 56/139  soft-tissue]
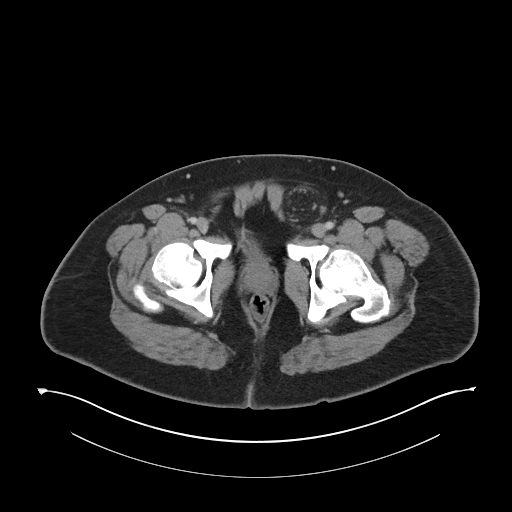
[im 67/139  soft-tissue]
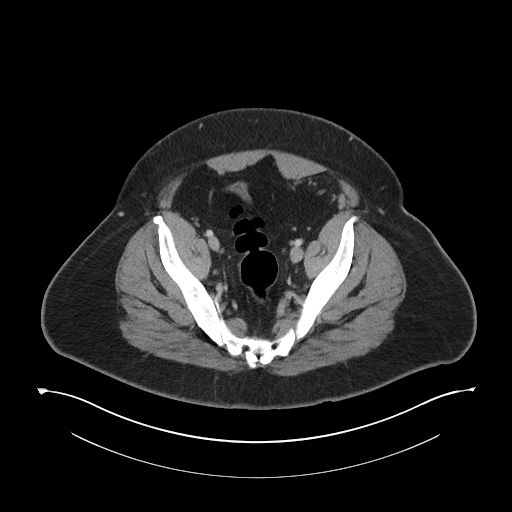
[im 78/139  soft-tissue]
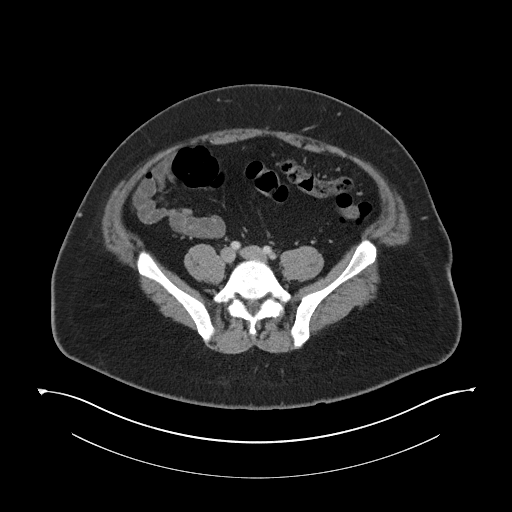
[im 89/139  soft-tissue]
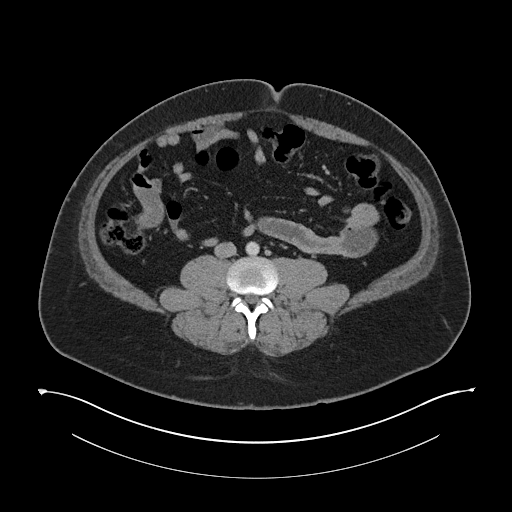
[im 100/139  soft-tissue]
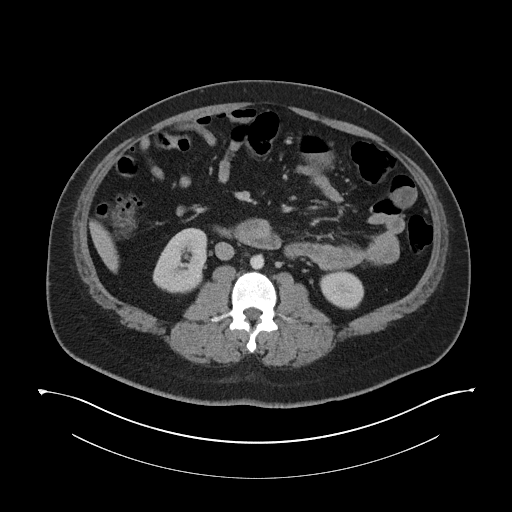
[im 100/139  bone]
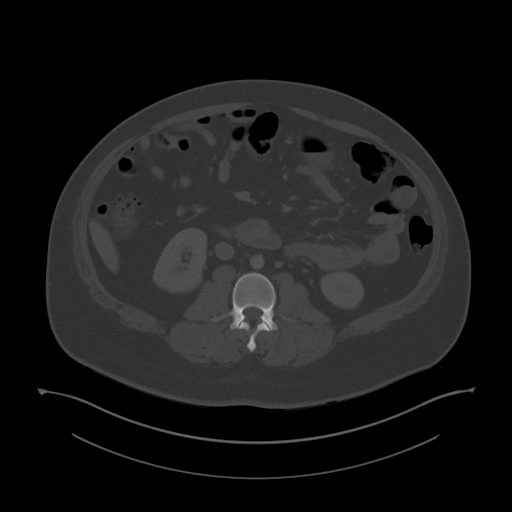
[im 111/139  soft-tissue]
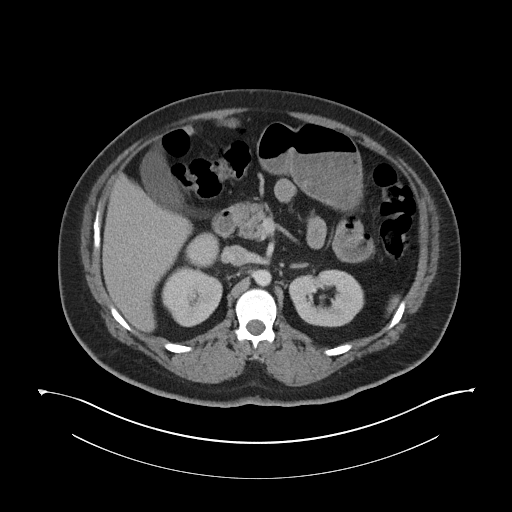
[im 122/139  soft-tissue]
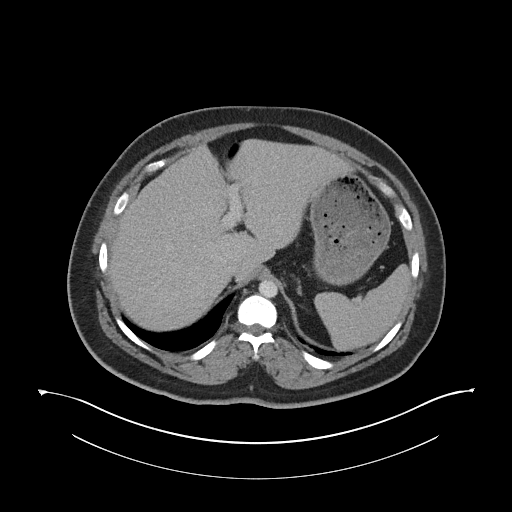
[im 133/139  soft-tissue]
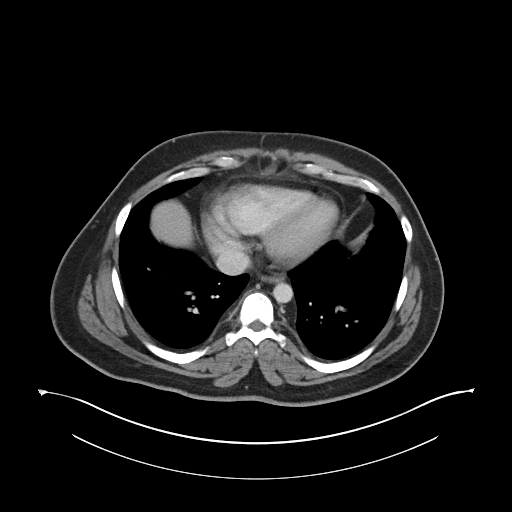

[Series 7: coronal soft tissue · coronal · 0.95mm/px · 3 of 124 slices shown]
[im 42/124  soft-tissue]
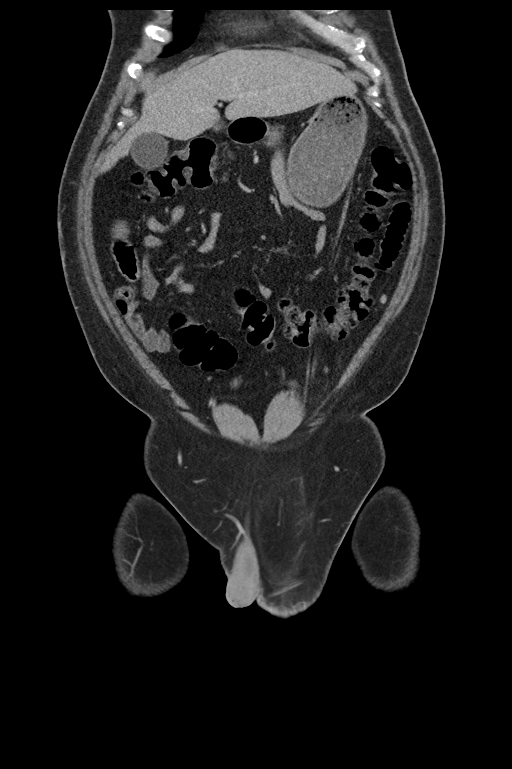
[im 55/124  soft-tissue]
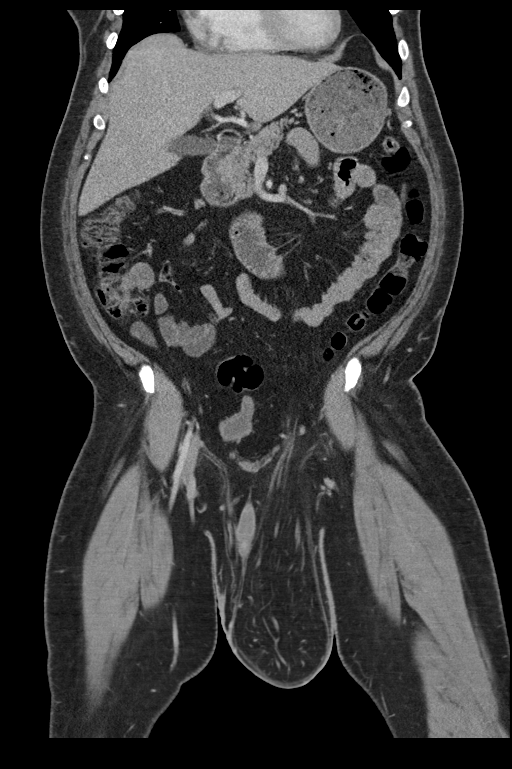
[im 69/124  soft-tissue]
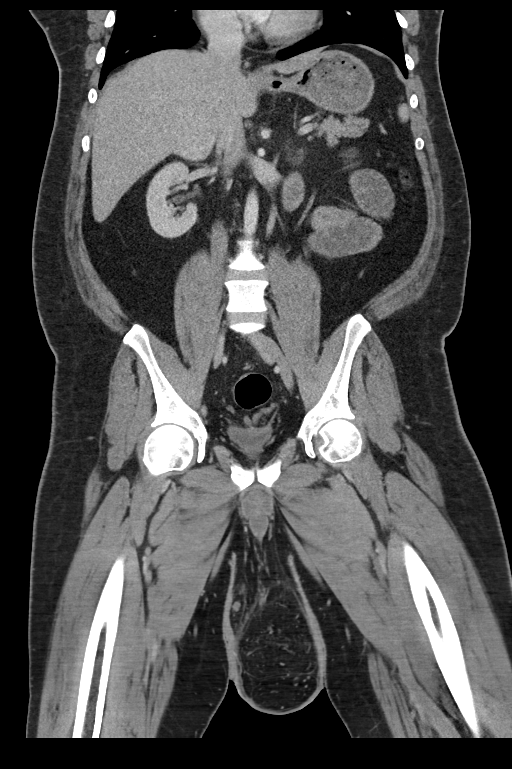

[15 of 46 positions shown; findings below may reference images not displayed]

FINDINGS: Lower chest: The visualized lung bases are clear.

No intra-abdominal free air or free fluid.

Hepatobiliary: There is a 4.2 x 3.2 cm rounded soft tissue structure
arising from the inferior aspect of the caudate lobe of the liver
([DATE] and 63/7). This demonstrates similar enhancement as the
background liver parenchyma and may represent a normal liver tissue
or less likely a focal nodular hyperplasia. There is no intrahepatic
biliary ductal dilatation. The gallbladder is unremarkable.

Pancreas: Unremarkable. No pancreatic ductal dilatation or
surrounding inflammatory changes.

Spleen: Normal in size without focal abnormality.

Adrenals/Urinary Tract: Adrenal glands are unremarkable. Kidneys are
normal, without renal calculi, focal lesion, or hydronephrosis.
Bladder is unremarkable.

Stomach/Bowel: There is no bowel obstruction or active inflammation.
The appendix is not visualized with certainty. No inflammatory
changes identified in the right lower quadrant.

Vascular/Lymphatic: The abdominal aorta and IVC are unremarkable. No
portal venous gas. There is no adenopathy.

Reproductive: The prostate and seminal vesicles are grossly
unremarkable. There is a moderate size fat containing left inguinal
hernia with associated mass effect on the left scrotum. There is
minimal stranding of the inferior aspect of the herniated fat which
may represent mild edema. No fluid collection.

Other: Small fat containing right paraumbilical hernia.

Musculoskeletal: No acute or significant osseous findings.
IMPRESSION: 1. Moderate size fat containing left inguinal hernia with associated
mass effect on the left scrotum. There is minimal stranding of the
inferior aspect of the herniated fat which may represent mild edema.
No fluid collection.
2. A 4.2 x 3.2 cm rounded soft tissue from the inferior aspect of
the caudate lobe of the liver may represent a normal liver tissue or
less likely a focal nodular hyperplasia. MRI may provide better
evaluation if clinically indicated.

## 2024-06-15 ENCOUNTER — Encounter (HOSPITAL_COMMUNITY): Payer: Self-pay | Admitting: General Surgery
